# Patient Record
Sex: Female | Born: 1960 | Race: White | Hispanic: No | Marital: Married | Smoking: Current some day smoker
Health system: Southern US, Community
[De-identification: ages and names within clinical notes are randomized; demographics above are authoritative.]

## PROBLEM LIST (undated history)

## (undated) DIAGNOSIS — C4490 Unspecified malignant neoplasm of skin, unspecified: Secondary | ICD-10-CM

## (undated) DIAGNOSIS — G8929 Other chronic pain: Secondary | ICD-10-CM

## (undated) DIAGNOSIS — C801 Malignant (primary) neoplasm, unspecified: Secondary | ICD-10-CM

## (undated) DIAGNOSIS — K219 Gastro-esophageal reflux disease without esophagitis: Secondary | ICD-10-CM

## (undated) DIAGNOSIS — G4700 Insomnia, unspecified: Secondary | ICD-10-CM

## (undated) DIAGNOSIS — I10 Essential (primary) hypertension: Secondary | ICD-10-CM

## (undated) DIAGNOSIS — F419 Anxiety disorder, unspecified: Secondary | ICD-10-CM

## (undated) DIAGNOSIS — C7800 Secondary malignant neoplasm of unspecified lung: Secondary | ICD-10-CM

## (undated) DIAGNOSIS — M1990 Unspecified osteoarthritis, unspecified site: Secondary | ICD-10-CM

## (undated) HISTORY — PX: ABDOMINAL HYSTERECTOMY: SHX81

## (undated) HISTORY — DX: Unspecified osteoarthritis, unspecified site: M19.90

## (undated) HISTORY — DX: Malignant (primary) neoplasm, unspecified: C80.1

## (undated) HISTORY — DX: Secondary malignant neoplasm of unspecified lung: C78.00

## (undated) HISTORY — DX: Essential (primary) hypertension: I10

## (undated) HISTORY — DX: Unspecified malignant neoplasm of skin, unspecified: C44.90

## (undated) HISTORY — PX: OTHER SURGICAL HISTORY: SHX169

## (undated) HISTORY — DX: Insomnia, unspecified: G47.00

## (undated) HISTORY — DX: Anxiety disorder, unspecified: F41.9

---

## 2013-07-13 ENCOUNTER — Emergency Department: Payer: Self-pay | Admitting: Internal Medicine

## 2013-07-13 LAB — CBC
HCT: 41.5 % (ref 35.0–47.0)
HGB: 13.9 g/dL (ref 12.0–16.0)
MCH: 31.1 pg (ref 26.0–34.0)
MCHC: 33.6 g/dL (ref 32.0–36.0)
MCV: 93 fL (ref 80–100)
Platelet: 269 10*3/uL (ref 150–440)
RBC: 4.49 10*6/uL (ref 3.80–5.20)
RDW: 12.6 % (ref 11.5–14.5)
WBC: 12.1 10*3/uL — AB (ref 3.6–11.0)

## 2013-07-13 LAB — BASIC METABOLIC PANEL
Anion Gap: 4 — ABNORMAL LOW (ref 7–16)
BUN: 13 mg/dL (ref 7–18)
CREATININE: 0.64 mg/dL (ref 0.60–1.30)
Calcium, Total: 8.7 mg/dL (ref 8.5–10.1)
Chloride: 107 mmol/L (ref 98–107)
Co2: 27 mmol/L (ref 21–32)
EGFR (African American): >60
EGFR (Non-African Amer.): >60
GLUCOSE: 123 mg/dL — AB (ref 65–99)
OSMOLALITY: 277 (ref 275–301)
Potassium: 4.2 mmol/L (ref 3.5–5.1)
Sodium: 138 mmol/L (ref 136–145)

## 2013-07-13 LAB — TROPONIN I: Troponin-I: <0.02 ng/mL

## 2013-07-15 ENCOUNTER — Ambulatory Visit: Payer: Self-pay | Admitting: Oncology

## 2013-07-17 ENCOUNTER — Ambulatory Visit: Payer: Self-pay | Admitting: Oncology

## 2013-07-19 LAB — COMPREHENSIVE METABOLIC PANEL
ALBUMIN: 3.5 g/dL (ref 3.4–5.0)
Alkaline Phosphatase: 72 U/L
Anion Gap: 7 (ref 7–16)
BUN: 11 mg/dL (ref 7–18)
Bilirubin,Total: 0.2 mg/dL (ref 0.2–1.0)
CHLORIDE: 104 mmol/L (ref 98–107)
CO2: 30 mmol/L (ref 21–32)
Calcium, Total: 8.4 mg/dL — ABNORMAL LOW (ref 8.5–10.1)
Creatinine: 0.64 mg/dL (ref 0.60–1.30)
EGFR (African American): >60
EGFR (Non-African Amer.): >60
GLUCOSE: 129 mg/dL — AB (ref 65–99)
Osmolality: 282 (ref 275–301)
POTASSIUM: 4.2 mmol/L (ref 3.5–5.1)
SGOT(AST): 18 U/L (ref 15–37)
SGPT (ALT): 35 U/L (ref 12–78)
SODIUM: 141 mmol/L (ref 136–145)
Total Protein: 7 g/dL (ref 6.4–8.2)

## 2013-07-19 LAB — CBC CANCER CENTER
Basophil #: 0.1 x10 3/mm (ref 0.0–0.1)
Basophil %: 1.1 %
EOS ABS: 0.2 x10 3/mm (ref 0.0–0.7)
EOS PCT: 2.1 %
HCT: 38.4 % (ref 35.0–47.0)
HGB: 12.7 g/dL (ref 12.0–16.0)
Lymphocyte #: 3.7 x10 3/mm — ABNORMAL HIGH (ref 1.0–3.6)
Lymphocyte %: 44.1 %
MCH: 30.8 pg (ref 26.0–34.0)
MCHC: 33.2 g/dL (ref 32.0–36.0)
MCV: 93 fL (ref 80–100)
MONOS PCT: 8.1 %
Monocyte #: 0.7 x10 3/mm (ref 0.2–0.9)
NEUTROS ABS: 3.7 x10 3/mm (ref 1.4–6.5)
Neutrophil %: 44.6 %
Platelet: 245 x10 3/mm (ref 150–440)
RBC: 4.14 10*6/uL (ref 3.80–5.20)
RDW: 12.7 % (ref 11.5–14.5)
WBC: 8.3 x10 3/mm (ref 3.6–11.0)

## 2013-07-21 ENCOUNTER — Ambulatory Visit: Payer: Self-pay | Admitting: Oncology

## 2013-08-21 ENCOUNTER — Ambulatory Visit: Payer: Self-pay | Admitting: Oncology

## 2013-08-22 ENCOUNTER — Ambulatory Visit: Payer: Self-pay

## 2013-08-22 LAB — APTT: Activated PTT: 50.4 secs — ABNORMAL HIGH (ref 23.6–35.9)

## 2013-08-22 LAB — PROTIME-INR
INR: 1
Prothrombin Time: 13.1 secs (ref 11.5–14.7)

## 2013-08-29 LAB — COMPREHENSIVE METABOLIC PANEL
ALBUMIN: 3.7 g/dL (ref 3.4–5.0)
ALK PHOS: 53 U/L
Anion Gap: 9 (ref 7–16)
BUN: 7 mg/dL (ref 7–18)
Bilirubin,Total: 0.5 mg/dL (ref 0.2–1.0)
CO2: 30 mmol/L (ref 21–32)
Calcium, Total: 8.7 mg/dL (ref 8.5–10.1)
Chloride: 105 mmol/L (ref 98–107)
Creatinine: 0.72 mg/dL (ref 0.60–1.30)
EGFR (Non-African Amer.): >60
Glucose: 95 mg/dL (ref 65–99)
OSMOLALITY: 285 (ref 275–301)
POTASSIUM: 3.8 mmol/L (ref 3.5–5.1)
SGOT(AST): 13 U/L — ABNORMAL LOW (ref 15–37)
SGPT (ALT): 18 U/L (ref 12–78)
SODIUM: 144 mmol/L (ref 136–145)
Total Protein: 7.2 g/dL (ref 6.4–8.2)

## 2013-08-29 LAB — PROTIME-INR
INR: 1
Prothrombin Time: 13.3 secs (ref 11.5–14.7)

## 2013-08-29 LAB — CBC CANCER CENTER
BASOS PCT: 0.8 %
Basophil #: 0.1 x10 3/mm (ref 0.0–0.1)
EOS ABS: 0.1 x10 3/mm (ref 0.0–0.7)
Eosinophil %: 1.6 %
HCT: 38.5 % (ref 35.0–47.0)
HGB: 12.6 g/dL (ref 12.0–16.0)
Lymphocyte #: 2.6 x10 3/mm (ref 1.0–3.6)
Lymphocyte %: 31.2 %
MCH: 30 pg (ref 26.0–34.0)
MCHC: 32.8 g/dL (ref 32.0–36.0)
MCV: 91 fL (ref 80–100)
Monocyte #: 0.6 x10 3/mm (ref 0.2–0.9)
Monocyte %: 6.9 %
Neutrophil #: 4.9 x10 3/mm (ref 1.4–6.5)
Neutrophil %: 59.5 %
Platelet: 246 x10 3/mm (ref 150–440)
RBC: 4.21 10*6/uL (ref 3.80–5.20)
RDW: 12.2 % (ref 11.5–14.5)
WBC: 8.3 x10 3/mm (ref 3.6–11.0)

## 2013-08-29 LAB — APTT: ACTIVATED PTT: 33.5 s (ref 23.6–35.9)

## 2013-08-30 ENCOUNTER — Inpatient Hospital Stay: Payer: Self-pay

## 2013-08-30 DIAGNOSIS — C801 Malignant (primary) neoplasm, unspecified: Secondary | ICD-10-CM

## 2013-08-30 HISTORY — DX: Malignant (primary) neoplasm, unspecified: C80.1

## 2013-08-31 LAB — CBC WITH DIFFERENTIAL/PLATELET
BASOS ABS: 0 10*3/uL (ref 0.0–0.1)
BASOS PCT: 0.3 %
EOS PCT: 0.1 %
Eosinophil #: 0 10*3/uL (ref 0.0–0.7)
HCT: 36.4 % (ref 35.0–47.0)
HGB: 12.1 g/dL (ref 12.0–16.0)
Lymphocyte #: 2.3 10*3/uL (ref 1.0–3.6)
Lymphocyte %: 14.8 %
MCH: 30.3 pg (ref 26.0–34.0)
MCHC: 33.3 g/dL (ref 32.0–36.0)
MCV: 91 fL (ref 80–100)
Monocyte #: 1 x10 3/mm — ABNORMAL HIGH (ref 0.2–0.9)
Monocyte %: 6.4 %
Neutrophil #: 11.9 10*3/uL — ABNORMAL HIGH (ref 1.4–6.5)
Neutrophil %: 78.4 %
PLATELETS: 238 10*3/uL (ref 150–440)
RBC: 4.01 10*6/uL (ref 3.80–5.20)
RDW: 12.5 % (ref 11.5–14.5)
WBC: 15.2 10*3/uL — ABNORMAL HIGH (ref 3.6–11.0)

## 2013-08-31 LAB — BASIC METABOLIC PANEL
Anion Gap: 2 — ABNORMAL LOW (ref 7–16)
BUN: 7 mg/dL (ref 7–18)
CALCIUM: 8 mg/dL — AB (ref 8.5–10.1)
CHLORIDE: 106 mmol/L (ref 98–107)
CO2: 28 mmol/L (ref 21–32)
CREATININE: 0.64 mg/dL (ref 0.60–1.30)
EGFR (African American): >60
EGFR (Non-African Amer.): >60
Glucose: 114 mg/dL — ABNORMAL HIGH (ref 65–99)
Osmolality: 271 (ref 275–301)
Potassium: 4.4 mmol/L (ref 3.5–5.1)
Sodium: 136 mmol/L (ref 136–145)

## 2013-09-01 LAB — CBC WITH DIFFERENTIAL/PLATELET
BASOS ABS: 0.1 10*3/uL (ref 0.0–0.1)
BASOS PCT: 0.4 %
Eosinophil #: 0.1 10*3/uL (ref 0.0–0.7)
Eosinophil %: 0.9 %
HCT: 35.4 % (ref 35.0–47.0)
HGB: 11.7 g/dL — AB (ref 12.0–16.0)
LYMPHS ABS: 2.7 10*3/uL (ref 1.0–3.6)
Lymphocyte %: 20.9 %
MCH: 30.2 pg (ref 26.0–34.0)
MCHC: 33 g/dL (ref 32.0–36.0)
MCV: 92 fL (ref 80–100)
MONO ABS: 1 x10 3/mm — AB (ref 0.2–0.9)
MONOS PCT: 8.2 %
NEUTROS PCT: 69.6 %
Neutrophil #: 8.9 10*3/uL — ABNORMAL HIGH (ref 1.4–6.5)
PLATELETS: 200 10*3/uL (ref 150–440)
RBC: 3.86 10*6/uL (ref 3.80–5.20)
RDW: 12.2 % (ref 11.5–14.5)
WBC: 12.7 10*3/uL — ABNORMAL HIGH (ref 3.6–11.0)

## 2013-09-02 LAB — CBC WITH DIFFERENTIAL/PLATELET
Basophil #: 0.1 10*3/uL (ref 0.0–0.1)
Basophil %: 0.7 %
Eosinophil #: 0.3 10*3/uL (ref 0.0–0.7)
Eosinophil %: 3 %
HCT: 33.4 % — AB (ref 35.0–47.0)
HGB: 11.2 g/dL — AB (ref 12.0–16.0)
Lymphocyte #: 2.5 10*3/uL (ref 1.0–3.6)
Lymphocyte %: 27.1 %
MCH: 30.6 pg (ref 26.0–34.0)
MCHC: 33.5 g/dL (ref 32.0–36.0)
MCV: 92 fL (ref 80–100)
Monocyte #: 0.9 x10 3/mm (ref 0.2–0.9)
Monocyte %: 10.1 %
NEUTROS PCT: 59.1 %
Neutrophil #: 5.4 10*3/uL (ref 1.4–6.5)
PLATELETS: 211 10*3/uL (ref 150–440)
RBC: 3.66 10*6/uL — ABNORMAL LOW (ref 3.80–5.20)
RDW: 12.5 % (ref 11.5–14.5)
WBC: 9.1 10*3/uL (ref 3.6–11.0)

## 2013-09-02 LAB — BASIC METABOLIC PANEL
Anion Gap: 5 — ABNORMAL LOW (ref 7–16)
BUN: 4 mg/dL — AB (ref 7–18)
CHLORIDE: 106 mmol/L (ref 98–107)
CREATININE: 0.46 mg/dL — AB (ref 0.60–1.30)
Calcium, Total: 7.9 mg/dL — ABNORMAL LOW (ref 8.5–10.1)
Co2: 28 mmol/L (ref 21–32)
EGFR (African American): >60
EGFR (Non-African Amer.): >60
Glucose: 93 mg/dL (ref 65–99)
Osmolality: 274 (ref 275–301)
POTASSIUM: 3.8 mmol/L (ref 3.5–5.1)
Sodium: 139 mmol/L (ref 136–145)

## 2013-09-04 LAB — PATHOLOGY REPORT

## 2013-09-12 LAB — CBC CANCER CENTER
BASOS ABS: 0.1 x10 3/mm (ref 0.0–0.1)
BASOS PCT: 1.8 %
EOS ABS: 0.5 x10 3/mm (ref 0.0–0.7)
Eosinophil %: 6.8 %
HCT: 37.8 % (ref 35.0–47.0)
HGB: 12.3 g/dL (ref 12.0–16.0)
LYMPHS PCT: 40.4 %
Lymphocyte #: 3.1 x10 3/mm (ref 1.0–3.6)
MCH: 30.1 pg (ref 26.0–34.0)
MCHC: 32.5 g/dL (ref 32.0–36.0)
MCV: 93 fL (ref 80–100)
MONOS PCT: 6.6 %
Monocyte #: 0.5 x10 3/mm (ref 0.2–0.9)
Neutrophil #: 3.4 x10 3/mm (ref 1.4–6.5)
Neutrophil %: 44.4 %
PLATELETS: 334 x10 3/mm (ref 150–440)
RBC: 4.09 10*6/uL (ref 3.80–5.20)
RDW: 12.2 % (ref 11.5–14.5)
WBC: 7.7 x10 3/mm (ref 3.6–11.0)

## 2013-09-12 LAB — COMPREHENSIVE METABOLIC PANEL
ALBUMIN: 3.6 g/dL (ref 3.4–5.0)
Alkaline Phosphatase: 62 U/L
Anion Gap: 8 (ref 7–16)
BUN: 14 mg/dL (ref 7–18)
Bilirubin,Total: 0.1 mg/dL — ABNORMAL LOW (ref 0.2–1.0)
CO2: 31 mmol/L (ref 21–32)
CREATININE: 0.73 mg/dL (ref 0.60–1.30)
Calcium, Total: 9.2 mg/dL (ref 8.5–10.1)
Chloride: 104 mmol/L (ref 98–107)
EGFR (African American): >60
EGFR (Non-African Amer.): >60
Glucose: 107 mg/dL — ABNORMAL HIGH (ref 65–99)
Osmolality: 286 (ref 275–301)
Potassium: 4.5 mmol/L (ref 3.5–5.1)
SGOT(AST): 12 U/L — ABNORMAL LOW (ref 15–37)
SGPT (ALT): 25 U/L (ref 12–78)
Sodium: 143 mmol/L (ref 136–145)
Total Protein: 7.2 g/dL (ref 6.4–8.2)

## 2013-09-20 ENCOUNTER — Ambulatory Visit: Payer: Self-pay | Admitting: Oncology

## 2013-09-30 LAB — COMPREHENSIVE METABOLIC PANEL
ALBUMIN: 3.3 g/dL — AB (ref 3.4–5.0)
ALK PHOS: 69 U/L
ALT: 26 U/L (ref 12–78)
ANION GAP: 6 — AB (ref 7–16)
AST: 20 U/L (ref 15–37)
BUN: 10 mg/dL (ref 7–18)
Bilirubin,Total: 0.3 mg/dL (ref 0.2–1.0)
CHLORIDE: 101 mmol/L (ref 98–107)
Calcium, Total: 8.5 mg/dL (ref 8.5–10.1)
Co2: 30 mmol/L (ref 21–32)
Creatinine: 0.74 mg/dL (ref 0.60–1.30)
EGFR (African American): >60
Glucose: 117 mg/dL — ABNORMAL HIGH (ref 65–99)
Osmolality: 274 (ref 275–301)
Potassium: 4.1 mmol/L (ref 3.5–5.1)
Sodium: 137 mmol/L (ref 136–145)
Total Protein: 6.9 g/dL (ref 6.4–8.2)

## 2013-09-30 LAB — URINALYSIS, COMPLETE
Bilirubin,UR: NEGATIVE
GLUCOSE, UR: NEGATIVE mg/dL (ref 0–75)
Ketone: NEGATIVE
Nitrite: NEGATIVE
PH: 7 (ref 4.5–8.0)
Protein: NEGATIVE
RBC,UR: 2 /HPF (ref 0–5)
Specific Gravity: 1.006 (ref 1.003–1.030)
Squamous Epithelial: 1

## 2013-09-30 LAB — CBC CANCER CENTER
Basophil #: 0.1 x10 3/mm (ref 0.0–0.1)
Basophil %: 0.9 %
EOS ABS: 0.4 x10 3/mm (ref 0.0–0.7)
Eosinophil %: 6.6 %
HCT: 34 % — ABNORMAL LOW (ref 35.0–47.0)
HGB: 11.6 g/dL — ABNORMAL LOW (ref 12.0–16.0)
Lymphocyte #: 2 x10 3/mm (ref 1.0–3.6)
Lymphocyte %: 36.6 %
MCH: 30.6 pg (ref 26.0–34.0)
MCHC: 34.1 g/dL (ref 32.0–36.0)
MCV: 90 fL (ref 80–100)
MONOS PCT: 11.4 %
Monocyte #: 0.6 x10 3/mm (ref 0.2–0.9)
Neutrophil #: 2.5 x10 3/mm (ref 1.4–6.5)
Neutrophil %: 44.5 %
PLATELETS: 255 x10 3/mm (ref 150–440)
RBC: 3.78 10*6/uL — AB (ref 3.80–5.20)
RDW: 12.1 % (ref 11.5–14.5)
WBC: 5.6 x10 3/mm (ref 3.6–11.0)

## 2013-10-01 LAB — URINE CULTURE

## 2013-10-07 LAB — CBC CANCER CENTER
BASOS ABS: 0 x10 3/mm (ref 0.0–0.1)
Basophil %: 0.5 %
Eosinophil #: 0.3 x10 3/mm (ref 0.0–0.7)
Eosinophil %: 3.9 %
HCT: 36.5 % (ref 35.0–47.0)
HGB: 12.7 g/dL (ref 12.0–16.0)
Lymphocyte #: 2.5 x10 3/mm (ref 1.0–3.6)
Lymphocyte %: 30.4 %
MCH: 30.6 pg (ref 26.0–34.0)
MCHC: 34.6 g/dL (ref 32.0–36.0)
MCV: 88 fL (ref 80–100)
MONOS PCT: 1 %
Monocyte #: 0.1 x10 3/mm — ABNORMAL LOW (ref 0.2–0.9)
NEUTROS ABS: 5.3 x10 3/mm (ref 1.4–6.5)
Neutrophil %: 64.2 %
PLATELETS: 270 x10 3/mm (ref 150–440)
RBC: 4.14 10*6/uL (ref 3.80–5.20)
RDW: 12.1 % (ref 11.5–14.5)
WBC: 8.3 x10 3/mm (ref 3.6–11.0)

## 2013-10-15 LAB — CBC CANCER CENTER
Basophil #: 0 x10 3/mm (ref 0.0–0.1)
Basophil %: 0.4 %
EOS ABS: 0.1 x10 3/mm (ref 0.0–0.7)
Eosinophil %: 1.1 %
HCT: 39.3 % (ref 35.0–47.0)
HGB: 13.2 g/dL (ref 12.0–16.0)
Lymphocyte #: 2 x10 3/mm (ref 1.0–3.6)
Lymphocyte %: 26.6 %
MCH: 30.2 pg (ref 26.0–34.0)
MCHC: 33.6 g/dL (ref 32.0–36.0)
MCV: 90 fL (ref 80–100)
Monocyte #: 0.4 x10 3/mm (ref 0.2–0.9)
Monocyte %: 5 %
NEUTROS PCT: 66.9 %
Neutrophil #: 4.9 x10 3/mm (ref 1.4–6.5)
PLATELETS: 218 x10 3/mm (ref 150–440)
RBC: 4.38 10*6/uL (ref 3.80–5.20)
RDW: 12 % (ref 11.5–14.5)
WBC: 7.4 x10 3/mm (ref 3.6–11.0)

## 2013-10-21 ENCOUNTER — Ambulatory Visit: Payer: Self-pay | Admitting: Oncology

## 2013-10-21 LAB — CBC CANCER CENTER
BASOS ABS: 0 x10 3/mm (ref 0.0–0.1)
Basophil %: 0.7 %
EOS PCT: 6.5 %
Eosinophil #: 0.4 x10 3/mm (ref 0.0–0.7)
HCT: 36.8 % (ref 35.0–47.0)
HGB: 12.4 g/dL (ref 12.0–16.0)
Lymphocyte #: 3.4 x10 3/mm (ref 1.0–3.6)
Lymphocyte %: 59.1 %
MCH: 30.6 pg (ref 26.0–34.0)
MCHC: 33.7 g/dL (ref 32.0–36.0)
MCV: 91 fL (ref 80–100)
MONOS PCT: 9.1 %
Monocyte #: 0.5 x10 3/mm (ref 0.2–0.9)
NEUTROS ABS: 1.4 x10 3/mm (ref 1.4–6.5)
NEUTROS PCT: 24.6 %
PLATELETS: 314 x10 3/mm (ref 150–440)
RBC: 4.04 10*6/uL (ref 3.80–5.20)
RDW: 12.9 % (ref 11.5–14.5)
WBC: 5.8 x10 3/mm (ref 3.6–11.0)

## 2013-10-29 LAB — COMPREHENSIVE METABOLIC PANEL
ANION GAP: 7 (ref 7–16)
Albumin: 3.7 g/dL (ref 3.4–5.0)
Alkaline Phosphatase: 55 U/L
BUN: 8 mg/dL (ref 7–18)
Bilirubin,Total: 0.3 mg/dL (ref 0.2–1.0)
Calcium, Total: 8.8 mg/dL (ref 8.5–10.1)
Chloride: 106 mmol/L (ref 98–107)
Co2: 31 mmol/L (ref 21–32)
Creatinine: 0.66 mg/dL (ref 0.60–1.30)
EGFR (African American): >60
Glucose: 107 mg/dL — ABNORMAL HIGH (ref 65–99)
Osmolality: 286 (ref 275–301)
POTASSIUM: 4.1 mmol/L (ref 3.5–5.1)
SGOT(AST): 28 U/L (ref 15–37)
SGPT (ALT): 51 U/L (ref 12–78)
Sodium: 144 mmol/L (ref 136–145)
Total Protein: 7.2 g/dL (ref 6.4–8.2)

## 2013-10-29 LAB — CBC CANCER CENTER
BASOS PCT: 0.8 %
Basophil #: 0.1 x10 3/mm (ref 0.0–0.1)
EOS ABS: 0.1 x10 3/mm (ref 0.0–0.7)
Eosinophil %: 2.2 %
HCT: 38 % (ref 35.0–47.0)
HGB: 12.8 g/dL (ref 12.0–16.0)
LYMPHS ABS: 2.1 x10 3/mm (ref 1.0–3.6)
LYMPHS PCT: 34.8 %
MCH: 30.5 pg (ref 26.0–34.0)
MCHC: 33.7 g/dL (ref 32.0–36.0)
MCV: 91 fL (ref 80–100)
Monocyte #: 0.4 x10 3/mm (ref 0.2–0.9)
Monocyte %: 6.9 %
NEUTROS ABS: 3.4 x10 3/mm (ref 1.4–6.5)
NEUTROS PCT: 55.3 %
Platelet: 261 x10 3/mm (ref 150–440)
RBC: 4.2 10*6/uL (ref 3.80–5.20)
RDW: 13.7 % (ref 11.5–14.5)
WBC: 6.2 x10 3/mm (ref 3.6–11.0)

## 2013-11-04 LAB — CBC CANCER CENTER
BASOS PCT: 0.8 %
Basophil #: 0.1 x10 3/mm (ref 0.0–0.1)
EOS PCT: 0.6 %
Eosinophil #: 0 x10 3/mm (ref 0.0–0.7)
HCT: 39.4 % (ref 35.0–47.0)
HGB: 13.2 g/dL (ref 12.0–16.0)
Lymphocyte #: 2.4 x10 3/mm (ref 1.0–3.6)
Lymphocyte %: 33.5 %
MCH: 30.5 pg (ref 26.0–34.0)
MCHC: 33.4 g/dL (ref 32.0–36.0)
MCV: 91 fL (ref 80–100)
MONO ABS: 0.3 x10 3/mm (ref 0.2–0.9)
MONOS PCT: 3.7 %
Neutrophil #: 4.4 x10 3/mm (ref 1.4–6.5)
Neutrophil %: 61.4 %
PLATELETS: 210 x10 3/mm (ref 150–440)
RBC: 4.32 10*6/uL (ref 3.80–5.20)
RDW: 13.5 % (ref 11.5–14.5)
WBC: 7.2 x10 3/mm (ref 3.6–11.0)

## 2013-11-11 LAB — CBC CANCER CENTER
Basophil #: 0 x10 3/mm (ref 0.0–0.1)
Basophil %: 0.6 %
EOS PCT: 0.8 %
Eosinophil #: 0 x10 3/mm (ref 0.0–0.7)
HCT: 37.9 % (ref 35.0–47.0)
HGB: 12.6 g/dL (ref 12.0–16.0)
Lymphocyte #: 2.6 x10 3/mm (ref 1.0–3.6)
Lymphocyte %: 41.3 %
MCH: 30.6 pg (ref 26.0–34.0)
MCHC: 33.3 g/dL (ref 32.0–36.0)
MCV: 92 fL (ref 80–100)
MONO ABS: 0.5 x10 3/mm (ref 0.2–0.9)
Monocyte %: 8.1 %
NEUTROS ABS: 3 x10 3/mm (ref 1.4–6.5)
Neutrophil %: 49.2 %
Platelet: 196 x10 3/mm (ref 150–440)
RBC: 4.12 10*6/uL (ref 3.80–5.20)
RDW: 13.9 % (ref 11.5–14.5)
WBC: 6.2 x10 3/mm (ref 3.6–11.0)

## 2013-11-11 LAB — COMPREHENSIVE METABOLIC PANEL
ANION GAP: 8 (ref 7–16)
Albumin: 3.7 g/dL (ref 3.4–5.0)
Alkaline Phosphatase: 64 U/L
BUN: 6 mg/dL — AB (ref 7–18)
Bilirubin,Total: 0.3 mg/dL (ref 0.2–1.0)
CALCIUM: 8.8 mg/dL (ref 8.5–10.1)
CHLORIDE: 102 mmol/L (ref 98–107)
Co2: 30 mmol/L (ref 21–32)
Creatinine: 0.65 mg/dL (ref 0.60–1.30)
EGFR (African American): >60
EGFR (Non-African Amer.): >60
GLUCOSE: 108 mg/dL — AB (ref 65–99)
Osmolality: 278 (ref 275–301)
POTASSIUM: 4 mmol/L (ref 3.5–5.1)
SGOT(AST): 21 U/L (ref 15–37)
SGPT (ALT): 34 U/L (ref 12–78)
Sodium: 140 mmol/L (ref 136–145)
Total Protein: 7.3 g/dL (ref 6.4–8.2)

## 2013-11-18 LAB — COMPREHENSIVE METABOLIC PANEL
ALBUMIN: 3.4 g/dL (ref 3.4–5.0)
ALK PHOS: 65 U/L
AST: 29 U/L (ref 15–37)
Anion Gap: 4 — ABNORMAL LOW (ref 7–16)
BILIRUBIN TOTAL: 0.3 mg/dL (ref 0.2–1.0)
BUN: 7 mg/dL (ref 7–18)
CHLORIDE: 105 mmol/L (ref 98–107)
CREATININE: 0.82 mg/dL (ref 0.60–1.30)
Calcium, Total: 8.1 mg/dL — ABNORMAL LOW (ref 8.5–10.1)
Co2: 29 mmol/L (ref 21–32)
EGFR (African American): >60
EGFR (Non-African Amer.): >60
Glucose: 101 mg/dL — ABNORMAL HIGH (ref 65–99)
OSMOLALITY: 274 (ref 275–301)
Potassium: 3.9 mmol/L (ref 3.5–5.1)
SGPT (ALT): 33 U/L (ref 12–78)
SODIUM: 138 mmol/L (ref 136–145)
Total Protein: 6.9 g/dL (ref 6.4–8.2)

## 2013-11-18 LAB — CBC CANCER CENTER
BASOS PCT: 0.7 %
Basophil #: 0 x10 3/mm (ref 0.0–0.1)
EOS PCT: 2.2 %
Eosinophil #: 0.1 x10 3/mm (ref 0.0–0.7)
HCT: 37.5 % (ref 35.0–47.0)
HGB: 12.5 g/dL (ref 12.0–16.0)
LYMPHS ABS: 2 x10 3/mm (ref 1.0–3.6)
Lymphocyte %: 40.7 %
MCH: 30.8 pg (ref 26.0–34.0)
MCHC: 33.4 g/dL (ref 32.0–36.0)
MCV: 92 fL (ref 80–100)
Monocyte #: 0.4 x10 3/mm (ref 0.2–0.9)
Monocyte %: 9 %
Neutrophil #: 2.3 x10 3/mm (ref 1.4–6.5)
Neutrophil %: 47.4 %
PLATELETS: 311 x10 3/mm (ref 150–440)
RBC: 4.06 10*6/uL (ref 3.80–5.20)
RDW: 14.7 % — ABNORMAL HIGH (ref 11.5–14.5)
WBC: 5 x10 3/mm (ref 3.6–11.0)

## 2013-11-20 ENCOUNTER — Ambulatory Visit: Payer: Self-pay | Admitting: Oncology

## 2013-11-25 LAB — CBC CANCER CENTER
BASOS ABS: 0.1 x10 3/mm (ref 0.0–0.1)
BASOS PCT: 0.9 %
Eosinophil #: 0.1 x10 3/mm (ref 0.0–0.7)
Eosinophil %: 1 %
HCT: 35.9 % (ref 35.0–47.0)
HGB: 12.1 g/dL (ref 12.0–16.0)
LYMPHS ABS: 3.1 x10 3/mm (ref 1.0–3.6)
LYMPHS PCT: 51.4 %
MCH: 31.2 pg (ref 26.0–34.0)
MCHC: 33.8 g/dL (ref 32.0–36.0)
MCV: 92 fL (ref 80–100)
MONO ABS: 0.4 x10 3/mm (ref 0.2–0.9)
MONOS PCT: 6.5 %
NEUTROS ABS: 2.4 x10 3/mm (ref 1.4–6.5)
Neutrophil %: 40.2 %
Platelet: 209 x10 3/mm (ref 150–440)
RBC: 3.9 10*6/uL (ref 3.80–5.20)
RDW: 14.6 % — AB (ref 11.5–14.5)
WBC: 5.9 x10 3/mm (ref 3.6–11.0)

## 2013-12-02 LAB — CBC CANCER CENTER
BASOS PCT: 0.7 %
Basophil #: 0.1 x10 3/mm (ref 0.0–0.1)
EOS PCT: 0.7 %
Eosinophil #: 0.1 x10 3/mm (ref 0.0–0.7)
HCT: 36.6 % (ref 35.0–47.0)
HGB: 12 g/dL (ref 12.0–16.0)
LYMPHS ABS: 3.1 x10 3/mm (ref 1.0–3.6)
Lymphocyte %: 41.2 %
MCH: 31.2 pg (ref 26.0–34.0)
MCHC: 32.9 g/dL (ref 32.0–36.0)
MCV: 95 fL (ref 80–100)
MONO ABS: 0.8 x10 3/mm (ref 0.2–0.9)
Monocyte %: 9.9 %
NEUTROS ABS: 3.6 x10 3/mm (ref 1.4–6.5)
Neutrophil %: 47.5 %
PLATELETS: 193 x10 3/mm (ref 150–440)
RBC: 3.86 10*6/uL (ref 3.80–5.20)
RDW: 14.9 % — ABNORMAL HIGH (ref 11.5–14.5)
WBC: 7.6 x10 3/mm (ref 3.6–11.0)

## 2013-12-09 ENCOUNTER — Encounter: Payer: Self-pay | Admitting: General Surgery

## 2013-12-09 LAB — CBC CANCER CENTER
BASOS ABS: 0.1 x10 3/mm (ref 0.0–0.1)
Basophil %: 0.9 %
Eosinophil #: 0.1 x10 3/mm (ref 0.0–0.7)
Eosinophil %: 1.6 %
HCT: 40.8 % (ref 35.0–47.0)
HGB: 13.7 g/dL (ref 12.0–16.0)
LYMPHS PCT: 44.3 %
Lymphocyte #: 2.5 x10 3/mm (ref 1.0–3.6)
MCH: 31.3 pg (ref 26.0–34.0)
MCHC: 33.4 g/dL (ref 32.0–36.0)
MCV: 94 fL (ref 80–100)
MONO ABS: 0.7 x10 3/mm (ref 0.2–0.9)
Monocyte %: 11.8 %
Neutrophil #: 2.4 x10 3/mm (ref 1.4–6.5)
Neutrophil %: 41.4 %
Platelet: 344 x10 3/mm (ref 150–440)
RBC: 4.36 10*6/uL (ref 3.80–5.20)
RDW: 15.5 % — ABNORMAL HIGH (ref 11.5–14.5)
WBC: 5.8 x10 3/mm (ref 3.6–11.0)

## 2013-12-09 LAB — COMPREHENSIVE METABOLIC PANEL
ALBUMIN: 4.2 g/dL (ref 3.4–5.0)
ALK PHOS: 74 U/L
ALT: 55 U/L (ref 12–78)
AST: 52 U/L — AB (ref 15–37)
Anion Gap: 11 (ref 7–16)
BUN: 12 mg/dL (ref 7–18)
Bilirubin,Total: 0.5 mg/dL (ref 0.2–1.0)
CALCIUM: 9 mg/dL (ref 8.5–10.1)
CREATININE: 0.75 mg/dL (ref 0.60–1.30)
Chloride: 103 mmol/L (ref 98–107)
Co2: 25 mmol/L (ref 21–32)
EGFR (African American): >60
EGFR (Non-African Amer.): >60
Glucose: 106 mg/dL — ABNORMAL HIGH (ref 65–99)
Osmolality: 278 (ref 275–301)
Potassium: 3.7 mmol/L (ref 3.5–5.1)
Sodium: 139 mmol/L (ref 136–145)
Total Protein: 8.2 g/dL (ref 6.4–8.2)

## 2013-12-17 ENCOUNTER — Encounter: Payer: Self-pay | Admitting: General Surgery

## 2013-12-17 ENCOUNTER — Ambulatory Visit (INDEPENDENT_AMBULATORY_CARE_PROVIDER_SITE_OTHER): Payer: BC Managed Care – PPO | Admitting: General Surgery

## 2013-12-17 VITALS — BP 132/80 | HR 76 | Resp 16 | Ht 62.0 in | Wt 177.0 lb

## 2013-12-17 DIAGNOSIS — K8020 Calculus of gallbladder without cholecystitis without obstruction: Secondary | ICD-10-CM

## 2013-12-17 NOTE — Patient Instructions (Addendum)
Cholecystostomy  The gallbladder is a pear-shaped organ that lies beneath the liver on the right side of the body. The gallbladder stores bile, a fluid that helps the body digest fats. However, sometimes bile and other fluids build up in the gallbladder because of an obstruction (for example, a gallstones). This can cause fever, pain, swelling, nausea and other serious symptoms. The procedure used to drain these fluids is called a cholecystostomy. A tube is inserted into the gallbladder. Fluid drains through the tube into a plastic bag outside the body. This procedure is usually done on people who are admitted to the hospital. The procedure is often recommended for people who cannot have gallbladder surgery right away, usually because they are too ill to make it through surgery. The cholecystostomy tube is usually temporary, until surgery can be done. RISKS AND COMPLICATIONS Although rare, complications can include:  Clogging of the tube.  Infection in or around the drain site. Antibiotics might be prescribed for the infection. Or, another tube might be inserted to drain the infected fluid.  Internal bleeding from the liver. BEFORE THE PROCEDURE   Try to quit smoking several weeks before the procedure. Smoking can slow healing.  Arrange for someone to drive you home from the hospital.  Right before your procedure, avoid all foods and liquids after midnight. This includes coffee, tea and water.  On the day of the procedure, arrive early to fill out all the paperwork. PROCEDURE You will be given a sedative to make you sleepy and a local anesthetic to numb the skin. Next, a small cut is made in the abdomen. Then a tube is threaded through the cut into the gallbladder. The procedure is usually done with ultrasound to guide the tube into the gallbladder. Once the tube is in place, the drain is secured to the skin with a stitch. The tube is then connected to a drainage bag.  AFTER THE PROCEDURE    People who have a cholecystostomy usually stay in the hospital for several days because they are so ill. You might not be able to eat for the first few days. Instead, you will be connected to an IV for fluids and nutrients.  The procedure does not cure the blockage that caused the fluid to build up in the first place. Because of this, the gallbladder will need to be removed in the future. The drain is removed at that time. HOME CARE INSTRUCTIONS  Be sure to follow your healthcare provider's instructions carefully. You may shower but avoid tub baths and swimming until your caregiver says it is OK. Eat and drink according to the directions you have been given. And be sure to make all follow-up appointments.  Call your healthcare provider if you notice new pain, redness or swelling around the wound. SEEK IMMEDIATE MEDICAL CARE IF:   There is increased abdominal pain.  Nausea or vomiting occurs.  You develop a fever.  The drainage tube comes out of the abdomen. Document Released: 08/05/2008 Document Revised: 08/01/2011 Document Reviewed: 08/05/2008 [facility] Patient Information 2015 [place], [place]. This information is not intended to replace advice given to you by your health care provider. Make sure you discuss any questions you have with your health care provider.  Patient is scheduled for surgery at [facility] on 01/02/14. She will pre admit by phone on 12/24/13. Patient is aware of date and instructions.

## 2013-12-17 NOTE — Progress Notes (Signed)
Patient ID: Natalie Stark, female   DOB: 02/25/1961, 53 y.o.   MRN: [id number]  Chief Complaint  Patient presents with  . Abdominal Pain    choleycystitis    HPI Natalie Stark is a 53 y.o. female  Here for cholecystitis. Has been having sharp pains in her right side since the end of April 2015. Natalie KitchenPatient had a abdominal ultrasound on 12/02/13. She has a history of lung cancer and finished her chemotherapy on 12/09/13.  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 She received 4 cycles of cisplatin and and Alimta. She tolerated her treatments well, but began to have episodes of right upper quadrant pain.Natalie Stark  HPI  Past Medical History  Diagnosis Date  . Cancer 08/30/13    Lung, tx removal of upper left lobe and chemotherapy    Past Surgical History  Procedure Laterality Date  . Upper lobectomy Left   . Cesarean section    . Abdominal hysterectomy      Family History  Problem Relation Age of Onset  . Thyroid cancer Mother   . Thyroid cancer Brother     Social History History  Substance Use Topics  . Smoking status: Former Smoker -- 37 years    Quit date: 05/23/2013  . Smokeless tobacco: Never Used  . Alcohol Use: Yes    No Known Allergies  Current Outpatient Prescriptions  Medication Sig Dispense Refill  . benazepril (LOTENSIN) 40 MG tablet       . clonazePAM (KLONOPIN) 1 MG tablet       . estradiol (ESTRACE) 1 MG tablet       . folic acid (FOLVITE) 1 MG tablet       . oxyCODONE (OXY IR/ROXICODONE) 5 MG immediate release tablet       . promethazine (PHENERGAN) 25 MG tablet       . zolpidem (AMBIEN) 10 MG tablet        No current facility-administered medications for this visit.    Review of Systems Review of Systems  Constitutional: Negative.   Respiratory: Negative.   Cardiovascular: Negative.     Blood pressure 132/80, pulse 76, resp. rate 16, height 5\' 2"   (1.575 m), weight 177 lb (80.287 kg).  Physical Exam Physical Exam  Constitutional: She is oriented to person, place, and time. She appears well-developed and well-nourished.  Eyes: Conjunctivae are normal. No scleral icterus.  Neck: Neck supple.  Cardiovascular: Normal rate, regular rhythm and normal heart sounds.   Pulses:      Dorsalis pedis pulses are 2+ on the right side, and 2+ on the left side.       Posterior tibial pulses are 2+ on the right side, and 2+ on the left side.  Pulmonary/Chest: Effort normal and breath sounds normal.  Well healed left posterior lateral thoracotomy incision..   Abdominal: Soft. Normal appearance and bowel sounds are normal. There is tenderness in the right upper quadrant.  Neurological: She is alert and oriented to person, place, and time.  Skin: Skin is warm and dry.    Data Reviewed Abdominal ultrasound completed October 26, 2013 showed multiple gallstones measuring up to 1.5 cm. Common bile duct 0.5 cm. Diffuse gallbladder wall thickening. No pericholecystic fluid. Laboratory studies dated December 09, 2013 identified a white blood cell count of 5800 with 41% polys, 44% lymphocytes, hemoglobin 13.7 normal SGPT at 55, mildly elevated SGOT at 52. Normal bilirubin  of 0.5, alkaline phosphatase 74. Assessment    Symptomatic cholelithiasis.     Plan    Indication for elective cholecystectomy was reviewed. The risks associated with the procedure including bile duct injury and intestinal injury were discussed.       Patient is scheduled for surgery at [facility] on 01/02/14. She will pre admit by phone on 12/24/13. Patient is aware of date and instructions.   Robert Bellow 12/18/2013, 10:34 PM

## 2013-12-18 ENCOUNTER — Other Ambulatory Visit: Payer: Self-pay | Admitting: General Surgery

## 2013-12-18 DIAGNOSIS — K8020 Calculus of gallbladder without cholecystitis without obstruction: Secondary | ICD-10-CM

## 2013-12-18 DIAGNOSIS — C3490 Malignant neoplasm of unspecified part of unspecified bronchus or lung: Secondary | ICD-10-CM | POA: Insufficient documentation

## 2013-12-21 ENCOUNTER — Ambulatory Visit: Payer: Self-pay | Admitting: Oncology

## 2014-01-02 ENCOUNTER — Encounter: Payer: Self-pay | Admitting: General Surgery

## 2014-01-02 ENCOUNTER — Ambulatory Visit: Payer: Self-pay | Admitting: General Surgery

## 2014-01-02 DIAGNOSIS — K8010 Calculus of gallbladder with chronic cholecystitis without obstruction: Secondary | ICD-10-CM

## 2014-01-02 HISTORY — PX: CHOLECYSTECTOMY: SHX55

## 2014-01-03 LAB — PATHOLOGY REPORT

## 2014-01-06 ENCOUNTER — Encounter: Payer: Self-pay | Admitting: General Surgery

## 2014-01-09 ENCOUNTER — Encounter: Payer: Self-pay | Admitting: General Surgery

## 2014-01-09 ENCOUNTER — Ambulatory Visit (INDEPENDENT_AMBULATORY_CARE_PROVIDER_SITE_OTHER): Payer: Self-pay | Admitting: General Surgery

## 2014-01-09 VITALS — BP 144/86 | HR 88 | Resp 14 | Ht 62.0 in | Wt 177.0 lb

## 2014-01-09 DIAGNOSIS — R1013 Epigastric pain: Secondary | ICD-10-CM

## 2014-01-09 DIAGNOSIS — K3189 Other diseases of stomach and duodenum: Secondary | ICD-10-CM

## 2014-01-09 DIAGNOSIS — K30 Functional dyspepsia: Secondary | ICD-10-CM

## 2014-01-09 DIAGNOSIS — K8020 Calculus of gallbladder without cholecystitis without obstruction: Secondary | ICD-10-CM

## 2014-01-09 MED ORDER — OMEPRAZOLE 40 MG PO CPDR: 40.0000 mg | DELAYED_RELEASE_CAPSULE | Freq: Every day | ORAL | Status: DC

## 2014-01-09 NOTE — Progress Notes (Signed)
Patient ID: Natalie Stark, female   DOB: 06-13-1960, 53 y.o.   MRN: [id number]  Chief Complaint  Patient presents with  . Routine Post Op    Cholecystectomy     HPI Natalie Stark is a 53 y.o. female.  Here today for her postoperative visit, laparoscopic cholecystectomy on 01-02-14. States she is making progress. She does admit to having a bowel movement after she eats. She does notice "gas" and "indigestion" after she eats.  HPI  Past Medical History  Diagnosis Date  . Cancer 08/30/13    Lung, tx removal of upper left lobe and chemotherapy    Past Surgical History  Procedure Laterality Date  . Upper lobectomy Left   . Cesarean section    . Abdominal hysterectomy    . Cholecystectomy  01-02-14    Family History  Problem Relation Age of Onset  . Thyroid cancer Mother   . Thyroid cancer Brother     Social History History  Substance Use Topics  . Smoking status: Former Smoker -- 62 years    Quit date: 05/23/2013  . Smokeless tobacco: Never Used  . Alcohol Use: Yes    No Known Allergies  Current Outpatient Prescriptions  Medication Sig Dispense Refill  . benazepril (LOTENSIN) 40 MG tablet       . clonazePAM (KLONOPIN) 1 MG tablet       . estradiol (ESTRACE) 1 MG tablet       . folic acid (FOLVITE) 1 MG tablet       . oxyCODONE (OXY IR/ROXICODONE) 5 MG immediate release tablet Take 5 mg by mouth every 6 (six) hours as needed.       . promethazine (PHENERGAN) 25 MG tablet       . zolpidem (AMBIEN CR) 12.5 MG CR tablet Take 12.5 mg by mouth at bedtime as needed for sleep.      Marland Kitchen omeprazole (PRILOSEC) 40 MG capsule Take 1 capsule (40 mg total) by mouth daily.  30 capsule  0   No current facility-administered medications for this visit.    Review of Systems Review of Systems  Constitutional: Negative.   Cardiovascular: Negative.   Gastrointestinal: Positive for nausea and diarrhea. Negative for vomiting and constipation.    Blood pressure 144/86, pulse 88,  resp. rate 14, height 5\' 2"  (1.575 m), weight 177 lb (80.287 kg).  Physical Exam Physical Exam  Constitutional: She is oriented to person, place, and time. She appears well-developed and well-nourished.  Neck: Neck supple.  Cardiovascular: Normal rate, regular rhythm and normal heart sounds.   Pulmonary/Chest: Effort normal and breath sounds normal.  Abdominal: Soft. Normal appearance. There is tenderness.  Port sites healing well  Lymphadenopathy:    She has no cervical adenopathy.  Neurological: She is alert and oriented to person, place, and time.  Skin: Skin is warm and dry.    Data Reviewed Pathology showed chronic as well as early acute cholecystitis and cholelithiasis.  Assessment    Doing well status post cholecystectomy. Mild indigestion should respond to transient PPI.     Plan    The patient will be placed on Prilosec 40 mg daily for one month. She'll notify the office if this does not result in relief of her symptoms. Follow up otherwise will be on an as-needed basis.      PCP: Kirkland Hun 01/10/2014, 6:20 PM

## 2014-01-09 NOTE — Patient Instructions (Addendum)
The patient is aware to call back for any questions or concerns.  

## 2014-01-10 DIAGNOSIS — K30 Functional dyspepsia: Secondary | ICD-10-CM | POA: Insufficient documentation

## 2014-01-13 LAB — COMPREHENSIVE METABOLIC PANEL
Albumin: 4 g/dL (ref 3.4–5.0)
Alkaline Phosphatase: 76 U/L
Anion Gap: 14 (ref 7–16)
BUN: 8 mg/dL (ref 7–18)
Bilirubin,Total: 0.3 mg/dL (ref 0.2–1.0)
CALCIUM: 9.4 mg/dL (ref 8.5–10.1)
Chloride: 101 mmol/L (ref 98–107)
Co2: 26 mmol/L (ref 21–32)
Creatinine: 1.08 mg/dL (ref 0.60–1.30)
EGFR (African American): >60
GFR CALC NON AF AMER: 60 — AB
Glucose: 135 mg/dL — ABNORMAL HIGH (ref 65–99)
Osmolality: 282 (ref 275–301)
POTASSIUM: 4.1 mmol/L (ref 3.5–5.1)
SGOT(AST): 34 U/L (ref 15–37)
SGPT (ALT): 51 U/L
Sodium: 141 mmol/L (ref 136–145)
Total Protein: 7.8 g/dL (ref 6.4–8.2)

## 2014-01-13 LAB — CBC CANCER CENTER
Basophil #: 0.1 x10 3/mm (ref 0.0–0.1)
Basophil %: 0.8 %
Eosinophil #: 0.5 x10 3/mm (ref 0.0–0.7)
Eosinophil %: 4.8 %
HCT: 39.9 % (ref 35.0–47.0)
HGB: 13.4 g/dL (ref 12.0–16.0)
LYMPHS ABS: 4.8 x10 3/mm — AB (ref 1.0–3.6)
LYMPHS PCT: 46.2 %
MCH: 32.3 pg (ref 26.0–34.0)
MCHC: 33.5 g/dL (ref 32.0–36.0)
MCV: 96 fL (ref 80–100)
MONO ABS: 0.7 x10 3/mm (ref 0.2–0.9)
MONOS PCT: 6.8 %
Neutrophil #: 4.3 x10 3/mm (ref 1.4–6.5)
Neutrophil %: 41.4 %
Platelet: 258 x10 3/mm (ref 150–440)
RBC: 4.15 10*6/uL (ref 3.80–5.20)
RDW: 15.2 % — ABNORMAL HIGH (ref 11.5–14.5)
WBC: 10.4 x10 3/mm (ref 3.6–11.0)

## 2014-01-21 ENCOUNTER — Ambulatory Visit: Payer: Self-pay | Admitting: Oncology

## 2014-02-05 ENCOUNTER — Ambulatory Visit: Payer: Self-pay | Admitting: Oncology

## 2014-02-06 ENCOUNTER — Telehealth: Payer: Self-pay | Admitting: *Deleted

## 2014-02-06 NOTE — Telephone Encounter (Signed)
I talked with the patient and she said the Prilosec did help. Aware 3 months refill then PCP for future refills, pt agrees.

## 2014-02-06 NOTE — Telephone Encounter (Signed)
Message copied by Carson Myrtle on Thu Feb 06, 2014  8:47 AM ------      Message from: [place], [place]      Created: Thu Feb 06, 2014  8:36 AM       Patient seen last month post chole, placed on PPI for one month. Just received refill request. Check and see how she is doing.  I don't think she will need to be on this all the time. OK to refill if she thinks she needs it for 3 months, further refills by PCP/ cancer center ------

## 2014-02-25 ENCOUNTER — Ambulatory Visit: Payer: Self-pay | Admitting: Oncology

## 2014-02-25 LAB — COMPREHENSIVE METABOLIC PANEL
ALK PHOS: 74 U/L
AST: 27 U/L (ref 15–37)
Albumin: 3.6 g/dL (ref 3.4–5.0)
Anion Gap: 5 — ABNORMAL LOW (ref 7–16)
BUN: 11 mg/dL (ref 7–18)
Bilirubin,Total: 0.5 mg/dL (ref 0.2–1.0)
CO2: 31 mmol/L (ref 21–32)
CREATININE: 0.74 mg/dL (ref 0.60–1.30)
Calcium, Total: 8.7 mg/dL (ref 8.5–10.1)
Chloride: 103 mmol/L (ref 98–107)
EGFR (African American): >60
EGFR (Non-African Amer.): >60
GLUCOSE: 109 mg/dL — AB (ref 65–99)
OSMOLALITY: 278 (ref 275–301)
POTASSIUM: 3.7 mmol/L (ref 3.5–5.1)
SGPT (ALT): 37 U/L
SODIUM: 139 mmol/L (ref 136–145)
Total Protein: 6.9 g/dL (ref 6.4–8.2)

## 2014-02-25 LAB — CBC CANCER CENTER
Basophil #: 0.1 x10 3/mm (ref 0.0–0.1)
Basophil %: 0.8 %
Eosinophil #: 0.1 x10 3/mm (ref 0.0–0.7)
Eosinophil %: 1.3 %
HCT: 36.4 % (ref 35.0–47.0)
HGB: 11.8 g/dL — ABNORMAL LOW (ref 12.0–16.0)
LYMPHS PCT: 32.4 %
Lymphocyte #: 3 x10 3/mm (ref 1.0–3.6)
MCH: 31.3 pg (ref 26.0–34.0)
MCHC: 32.3 g/dL (ref 32.0–36.0)
MCV: 97 fL (ref 80–100)
MONO ABS: 0.6 x10 3/mm (ref 0.2–0.9)
MONOS PCT: 6.9 %
NEUTROS ABS: 5.4 x10 3/mm (ref 1.4–6.5)
NEUTROS PCT: 58.6 %
Platelet: 199 x10 3/mm (ref 150–440)
RBC: 3.76 10*6/uL — ABNORMAL LOW (ref 3.80–5.20)
RDW: 12.6 % (ref 11.5–14.5)
WBC: 9.3 x10 3/mm (ref 3.6–11.0)

## 2014-03-11 ENCOUNTER — Ambulatory Visit (INDEPENDENT_AMBULATORY_CARE_PROVIDER_SITE_OTHER): Payer: BC Managed Care – PPO | Admitting: General Surgery

## 2014-03-11 ENCOUNTER — Encounter: Payer: Self-pay | Admitting: General Surgery

## 2014-03-11 VITALS — BP 122/74 | HR 86 | Resp 14 | Ht 62.0 in | Wt 182.0 lb

## 2014-03-11 DIAGNOSIS — K529 Noninfective gastroenteritis and colitis, unspecified: Secondary | ICD-10-CM | POA: Insufficient documentation

## 2014-03-11 DIAGNOSIS — L729 Follicular cyst of the skin and subcutaneous tissue, unspecified: Secondary | ICD-10-CM | POA: Insufficient documentation

## 2014-03-11 MED ORDER — CHOLESTYRAMINE 4 GM/DOSE PO POWD: 4.0000 g | Freq: Every day | ORAL | Status: DC

## 2014-03-11 NOTE — Progress Notes (Signed)
Patient ID: Natalie Stark, female   DOB: 19-Oct-1960, 53 y.o.   MRN: [id number]  Chief Complaint  Patient presents with  . Cyst    back cyst    HPI Natalie Stark is a 53 y.o. female.  Here today for evaluation of several cyst. She has noticed them for several years and they occasionally drain. The worst is on her back but she has them behind her left ear. She is doing well since her gall bladder surgery, she just has loose/watery BM's daily. She takes imodium (8 tablets/daily).  HPI  Past Medical History  Diagnosis Date  . Cancer 08/30/13    Lung, tx removal of upper left lobe and chemotherapy    Past Surgical History  Procedure Laterality Date  . Upper lobectomy Left   . Cesarean section    . Abdominal hysterectomy    . Cholecystectomy  01-02-14    Family History  Problem Relation Age of Onset  . Thyroid cancer Mother   . Thyroid cancer Brother     Social History History  Substance Use Topics  . Smoking status: Former Smoker -- 35 years    Quit date: 05/23/2013  . Smokeless tobacco: Never Used  . Alcohol Use: Yes    Allergies  Allergen Reactions  . Seroquel [Quetiapine Fumarate] Other (See Comments)    hallunications    Current Outpatient Prescriptions  Medication Sig Dispense Refill  . benazepril (LOTENSIN) 40 MG tablet       . clonazePAM (KLONOPIN) 1 MG tablet       . estradiol (ESTRACE) 1 MG tablet       . loperamide (IMODIUM) 2 MG capsule Take by mouth daily.      . NUCYNTA 50 MG TABS tablet Take 50 mg by mouth every 4 (four) hours as needed.       Marland Kitchen omeprazole (PRILOSEC) 40 MG capsule Take 1 capsule (40 mg total) by mouth daily.  30 capsule  0  . zolpidem (AMBIEN CR) 12.5 MG CR tablet Take 12.5 mg by mouth at bedtime as needed for sleep.      . cholestyramine (QUESTRAN) 4 GM/DOSE powder Take 1 packet (4 g total) by mouth daily.  378 g  12   No current facility-administered medications for this visit.    Review of Systems Review of Systems   Constitutional: Negative.   Respiratory: Negative.   Cardiovascular: Negative.     Blood pressure 122/74, pulse 86, resp. rate 14, height 5\' 2"  (1.575 m), weight 182 lb (82.555 kg).  Physical Exam Physical Exam  Constitutional: She is oriented to person, place, and time. She appears well-developed and well-nourished.  Neurological: She is alert and oriented to person, place, and time.  Skin: Skin is warm and dry.  1.2 cm posterior skin cyst left of midline T5 area. 5 mm cyst n left ear lobe. 3 mm nodule inner aspect right upper arm.  Irregular dermal thickening posterior aspect of the left arm. Difficult to assess    Data Reviewed Gallbladder pathology from December 28, 2013: Chronic as well as early acute cholecystitis and cholelithiasis.  Assessment    Symptomatic skin cyst.  Postprandial diarrhea secondary to cholecystectomy.     Plan    The patient was amenable to cyst excision. This was completed making use of 10 cc of 0.5% Xylocaine 0.25% Marcaine with 1-200,000 units of epinephrine. The lesion was excised through elliptical incision. The scar tissue suggestive of chronicity was evident. Clean edges were obtained. The  deep tissue was approximated with 3-0 Vicryl figure-of-eight suture. The skin was approximated with 4-0 nylon interrupted simple sutures. Telfa and Tegaderm dressing applied.  We'll make use of a trial of Questran daily to see if this improves her diarrhea.     Trial of Questran, she will call back in 2 weeks with status update. Follow up with the nurse in one week for suture removal.   PCP: [facility] Ref Dr Coralee Pesa 03/11/2014, 9:53 PM

## 2014-03-11 NOTE — Patient Instructions (Addendum)
The patient is aware to call back for any questions or concerns. Call back in 12 weeks to let us know if medication is helping BM

## 2014-03-13 LAB — PATHOLOGY

## 2014-03-18 ENCOUNTER — Ambulatory Visit (INDEPENDENT_AMBULATORY_CARE_PROVIDER_SITE_OTHER): Payer: Self-pay | Admitting: *Deleted

## 2014-03-18 DIAGNOSIS — L729 Follicular cyst of the skin and subcutaneous tissue, unspecified: Secondary | ICD-10-CM

## 2014-03-18 NOTE — Progress Notes (Signed)
The sutures were removed and steri strips applied.

## 2014-03-23 ENCOUNTER — Ambulatory Visit: Payer: Self-pay | Admitting: Oncology

## 2014-03-24 ENCOUNTER — Encounter: Payer: Self-pay | Admitting: General Surgery

## 2014-04-07 ENCOUNTER — Telehealth: Payer: Self-pay | Admitting: *Deleted

## 2014-04-07 ENCOUNTER — Telehealth: Payer: Self-pay

## 2014-04-07 NOTE — Telephone Encounter (Signed)
Patient called to let us know that the medication for diarrhea Questran was not working. She reports watery to very loose stools that occur after eating. She also feels like she is going very often.

## 2014-04-09 NOTE — Telephone Encounter (Signed)
Please contact the patient and see if she is ever had a colonoscopy. This will be the next step for persistent diarrhea. If she has had an exam within the last year, she can pick up a prescription for Lomotil at the office as a trial.

## 2014-04-09 NOTE — Telephone Encounter (Signed)
Message left for patient to call back to discuss Dr Dwyane Luo instructions.

## 2014-04-09 NOTE — Telephone Encounter (Signed)
Spoke with patient and she says that the diarrhea has eased up with her eating only rice. She is amendable to having a Colonoscopy done as she has never had one. Patient is scheduled to come in to discuss this on 04/24/14 at 4:00 pm.

## 2014-04-24 ENCOUNTER — Encounter: Payer: Self-pay | Admitting: General Surgery

## 2014-04-24 ENCOUNTER — Ambulatory Visit (INDEPENDENT_AMBULATORY_CARE_PROVIDER_SITE_OTHER): Payer: BC Managed Care – PPO | Admitting: General Surgery

## 2014-04-24 VITALS — BP 112/62 | HR 88 | Resp 16 | Ht 62.0 in | Wt 175.0 lb

## 2014-04-24 DIAGNOSIS — R197 Diarrhea, unspecified: Secondary | ICD-10-CM

## 2014-04-24 DIAGNOSIS — Z1211 Encounter for screening for malignant neoplasm of colon: Secondary | ICD-10-CM

## 2014-04-24 MED ORDER — POLYETHYLENE GLYCOL 3350 17 GM/SCOOP PO POWD: ORAL | Status: DC

## 2014-04-24 NOTE — Patient Instructions (Addendum)
Colonoscopy A colonoscopy is an exam to look at the entire large intestine (colon). This exam can help find problems such as tumors, polyps, inflammation, and areas of bleeding. The exam takes about 1 hour.  LET [facility] CARE PROVIDER KNOW ABOUT:   Any allergies you have.  All medicines you are taking, including vitamins, herbs, eye drops, creams, and over-the-counter medicines.  Previous problems you or members of your family have had with the use of anesthetics.  Any blood disorders you have.  Previous surgeries you have had.  Medical conditions you have. RISKS AND COMPLICATIONS  Generally, this is a safe procedure. However, as with any procedure, complications can occur. Possible complications include:  Bleeding.  Tearing or rupture of the colon wall.  Reaction to medicines given during the exam.  Infection (rare). BEFORE THE PROCEDURE   Ask your health care provider about changing or stopping your regular medicines.  You may be prescribed an oral bowel prep. This involves drinking a large amount of medicated liquid, starting the day before your procedure. The liquid will cause you to have multiple loose stools until your stool is almost clear or light green. This cleans out your colon in preparation for the procedure.  Do not eat or drink anything else once you have started the bowel prep, unless your health care provider tells you it is safe to do so.  Arrange for someone to drive you home after the procedure. PROCEDURE   You will be given medicine to help you relax (sedative).  You will lie on your side with your knees bent.  A long, flexible tube with a light and camera on the end (colonoscope) will be inserted through the rectum and into the colon. The camera sends video back to a computer screen as it moves through the colon. The colonoscope also releases carbon dioxide gas to inflate the colon. This helps your health care provider see the area better.  During  the exam, your health care provider may take a small tissue sample (biopsy) to be examined under a microscope if any abnormalities are found.  The exam is finished when the entire colon has been viewed. AFTER THE PROCEDURE   Do not drive for 24 hours after the exam.  You may have a small amount of blood in your stool.  You may pass moderate amounts of gas and have mild abdominal cramping or bloating. This is caused by the gas used to inflate your colon during the exam.  Ask when your test results will be ready and how you will get your results. Make sure you get your test results. Document Released: 05/06/2000 Document Revised: 02/27/2013 Document Reviewed: 01/14/2013 [facility] Patient Information 2015 [place], [place]. This information is not intended to replace advice given to you by your health care provider. Make sure you discuss any questions you have with your health care provider.  Patient has been scheduled for a colonoscopy on 05-13-14 at [facility].

## 2014-04-24 NOTE — Telephone Encounter (Signed)
error 

## 2014-04-24 NOTE — Progress Notes (Signed)
Patient ID: SOLSTICE LASTINGER, female   DOB: 11/10/1960, 53 y.o.   MRN: [id number]  Chief Complaint  Patient presents with  . Other    evaluation for colonoscopy and diarrhea    HPI CAMDEN MAZZAFERRO is a 53 y.o. female who presents for an evaluation of a colonoscopy. She states she has had diarrhea since August 2015 when she had her gallbladder removed. She states the diarrhea is only in the mornings. The diarrhea is once and is yellowish in color. She denies any abdominal pain. No prior colonoscopies. She states the Lucrezia Starch is not helping.   HPI  Past Medical History  Diagnosis Date  . Cancer 08/30/13    Lung, tx removal of upper left lobe and chemotherapy    Past Surgical History  Procedure Laterality Date  . Upper lobectomy Left   . Cesarean section    . Abdominal hysterectomy    . Cholecystectomy  01-02-14    Family History  Problem Relation Age of Onset  . Thyroid cancer Mother   . Thyroid cancer Brother     Social History History  Substance Use Topics  . Smoking status: Current Every Day Smoker -- 0.25 packs/day for 35 years    Types: Cigarettes    Last Attempt to Quit: 05/23/2013  . Smokeless tobacco: Never Used  . Alcohol Use: 0.0 oz/week    0 Not specified per week    Allergies  Allergen Reactions  . Seroquel [Quetiapine Fumarate] Other (See Comments)    hallunications    Current Outpatient Prescriptions  Medication Sig Dispense Refill  . benazepril (LOTENSIN) 40 MG tablet     . cholestyramine (QUESTRAN) 4 GM/DOSE powder Take 1 packet (4 g total) by mouth daily. 378 g 12  . clonazePAM (KLONOPIN) 1 MG tablet     . estradiol (ESTRACE) 1 MG tablet     . lamoTRIgine (LAMICTAL) 25 MG tablet Take 25 mg by mouth daily.    Marland Kitchen loperamide (IMODIUM) 2 MG capsule Take by mouth daily.    Marland Kitchen lurasidone (LATUDA) 40 MG TABS tablet Take 40 mg by mouth daily with breakfast.    . NUCYNTA 50 MG TABS tablet Take 50 mg by mouth every 4 (four) hours as needed.     Marland Kitchen  omeprazole (PRILOSEC) 40 MG capsule Take 1 capsule (40 mg total) by mouth daily. 30 capsule 0  . zolpidem (AMBIEN CR) 12.5 MG CR tablet Take 12.5 mg by mouth at bedtime as needed for sleep.    . polyethylene glycol powder (GLYCOLAX/MIRALAX) powder 255 grams one bottle for colonoscopy prep 255 g 0   No current facility-administered medications for this visit.    Review of Systems Review of Systems  Constitutional: Negative.   Respiratory: Negative.   Cardiovascular: Negative.   Gastrointestinal: Positive for diarrhea.    Blood pressure 112/62, pulse 88, resp. rate 16, height 5\' 2"  (1.575 m), weight 175 lb (79.379 kg).  Physical Exam Physical Exam  Constitutional: She is oriented to person, place, and time. She appears well-developed and well-nourished.  Neck: Neck supple. No thyromegaly present.  Cardiovascular: Normal rate, regular rhythm and normal heart sounds.   No murmur heard. Pulmonary/Chest: Effort normal and breath sounds normal.  Abdominal: Soft. Normal appearance and bowel sounds are normal. There is no hepatosplenomegaly. There is no tenderness. No hernia.  Lymphadenopathy:    She has no cervical adenopathy.  Neurological: She is alert and oriented to person, place, and time.  Skin: Skin is warm and dry.  Data Reviewed Pathology on the gallbladder showed chronic and acute cholecystitis with cholelithiasis.  Assessment    Diarrhea, possibly related to previous cholecystectomy without improvement on bile salt binding therapy.    Plan    She is a age for a screening colonoscopy and this is been recommended to confirm that no additional pathology is evident. The risks associated with the procedure including bleeding and perforation were discussed.    Patient has been scheduled for a colonoscopy on 05-13-14 at [facility].   PCP:  Fuller Canada 04/25/2014, 2:00 PM

## 2014-04-25 ENCOUNTER — Other Ambulatory Visit: Payer: Self-pay | Admitting: General Surgery

## 2014-04-25 DIAGNOSIS — Z1211 Encounter for screening for malignant neoplasm of colon: Secondary | ICD-10-CM

## 2014-04-25 DIAGNOSIS — R197 Diarrhea, unspecified: Secondary | ICD-10-CM | POA: Insufficient documentation

## 2014-05-13 ENCOUNTER — Ambulatory Visit: Payer: Self-pay | Admitting: General Surgery

## 2014-05-13 DIAGNOSIS — R197 Diarrhea, unspecified: Secondary | ICD-10-CM

## 2014-05-13 DIAGNOSIS — D123 Benign neoplasm of transverse colon: Secondary | ICD-10-CM

## 2014-05-13 HISTORY — PX: COLONOSCOPY: SHX174

## 2014-05-14 ENCOUNTER — Telehealth: Payer: Self-pay | Admitting: *Deleted

## 2014-05-14 NOTE — Telephone Encounter (Signed)
She called to let you know she was still having diarrhea (2 this morning) watery. Is there anything else besides Imodium because Imodium does not work for her? She also wants to know if a stool sample to lab necessary?

## 2014-05-14 NOTE — Telephone Encounter (Signed)
She had watery stools today as she has not had solid food until yesterday.  She can try fibercon tablets: 2 twice a day pending biopsy results to provide some body to the stools.

## 2014-05-17 ENCOUNTER — Telehealth: Payer: Self-pay | Admitting: General Surgery

## 2014-05-17 NOTE — Telephone Encounter (Signed)
Notified pathology showed no evidence of colitis. Polyp at time of surgery in hepatic flexure tissue not retrieved (reported as fat and fecal material).  Multiple loose, pudding like stools with mucous overnight. (Prior to colonoscopy, she was having a loose, pudding like stool once / day. )  Denies exposure to ill family members.  Failed Imodium in the past.  Will call RX for Lomotil, 2.5 mg tablets, # 60, 2 po to start, then one po with each loose stool to max 6/ day. One refill authorized.  Polyp at time of colonoscopy was obvious, will recommend a f/u exam in five years.  Will have patient contacted by staff next week to see response to Lomotil.  If incomplete relief of daily AM loose BM, may need to consider small bowel study (without evidence of colitis on colonoscopy, unlikely enteric pathogens).

## 2014-05-19 ENCOUNTER — Encounter: Payer: Self-pay | Admitting: General Surgery

## 2014-05-20 ENCOUNTER — Encounter: Payer: Self-pay | Admitting: General Surgery

## 2014-05-20 ENCOUNTER — Encounter: Payer: Self-pay | Admitting: *Deleted

## 2014-05-20 NOTE — Telephone Encounter (Signed)
Please call 12/ 29 to see response to Lomotil for diarrhea. She states she had a normal BM yesterday. She is so glad and happy. She is taking one Lomotil at night. Aware to call for new concerns or issues. Place in recalls for colonoscopy in five years. Placed in [place]. Rosann Auerbach

## 2014-06-26 ENCOUNTER — Ambulatory Visit: Payer: Self-pay | Admitting: Oncology

## 2014-06-26 LAB — COMPREHENSIVE METABOLIC PANEL
ALT: 29 U/L (ref 14–63)
AST: 16 U/L (ref 15–37)
Albumin: 3.8 g/dL (ref 3.4–5.0)
Alkaline Phosphatase: 77 U/L (ref 46–116)
Anion Gap: 6 — ABNORMAL LOW (ref 7–16)
BUN: 12 mg/dL (ref 7–18)
Bilirubin,Total: 0.2 mg/dL (ref 0.2–1.0)
CALCIUM: 8.9 mg/dL (ref 8.5–10.1)
Chloride: 103 mmol/L (ref 98–107)
Co2: 33 mmol/L — ABNORMAL HIGH (ref 21–32)
Creatinine: 0.71 mg/dL (ref 0.60–1.30)
EGFR (Non-African Amer.): >60
GLUCOSE: 104 mg/dL — AB (ref 65–99)
Osmolality: 283 (ref 275–301)
POTASSIUM: 4.7 mmol/L (ref 3.5–5.1)
Sodium: 142 mmol/L (ref 136–145)
Total Protein: 7.4 g/dL (ref 6.4–8.2)

## 2014-06-26 LAB — CBC CANCER CENTER
BASOS ABS: 0.1 x10 3/mm (ref 0.0–0.1)
Basophil %: 0.9 %
Eosinophil #: 0.2 x10 3/mm (ref 0.0–0.7)
Eosinophil %: 2.7 %
HCT: 38.5 % (ref 35.0–47.0)
HGB: 12.9 g/dL (ref 12.0–16.0)
LYMPHS ABS: 2.4 x10 3/mm (ref 1.0–3.6)
Lymphocyte %: 30 %
MCH: 30.6 pg (ref 26.0–34.0)
MCHC: 33.4 g/dL (ref 32.0–36.0)
MCV: 92 fL (ref 80–100)
MONO ABS: 0.4 x10 3/mm (ref 0.2–0.9)
MONOS PCT: 5.4 %
Neutrophil #: 4.8 x10 3/mm (ref 1.4–6.5)
Neutrophil %: 61 %
Platelet: 248 x10 3/mm (ref 150–440)
RBC: 4.2 10*6/uL (ref 3.80–5.20)
RDW: 13.3 % (ref 11.5–14.5)
WBC: 7.9 x10 3/mm (ref 3.6–11.0)

## 2014-06-26 LAB — CREATININE, SERUM: CREATINE, SERUM: 0.71

## 2014-07-22 ENCOUNTER — Ambulatory Visit
Admit: 2014-07-22 | Disposition: A | Discharge status: Home / Self Care | Payer: Self-pay | Attending: Oncology | Admitting: Oncology

## 2014-09-05 ENCOUNTER — Other Ambulatory Visit: Payer: Self-pay | Admitting: Oncology

## 2014-09-05 DIAGNOSIS — C3490 Malignant neoplasm of unspecified part of unspecified bronchus or lung: Secondary | ICD-10-CM

## 2014-09-06 DIAGNOSIS — G4700 Insomnia, unspecified: Secondary | ICD-10-CM | POA: Insufficient documentation

## 2014-09-13 NOTE — Discharge Summary (Signed)
PATIENT NAME:  Natalie Stark, Natalie Stark MR#:  [id number] DATE OF BIRTH:  1960/11/05  DATE OF ADMISSION:  08/30/2013 DATE OF DISCHARGE:  09/06/2013  ADMITTING DIAGNOSIS: Left upper lobe mass.   DISCHARGE DIAGNOSIS: Left upper lobe mass. 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   HOSPITAL COURSE: Ms. Natalie Stark is a 54 year old white female who was recently diagnosed with a left upper lobe mass. She underwent a CT scan of the chest, which confirmed the presence of a large 2.5 cm left upper lobe mass and a smaller nodule in the left upper lobe. She was taken to the Operating Room on 08/30/2013, where she underwent a left thoracotomy and left upper lobectomy. The final pathology showed 2 separate adenocarcinomas in the lung, staging this as a T3 N0 M0. The patient recovered in the intensive care unit overnight and then was transferred to the floor. Her postoperative course was essentially unremarkable. She was able to ambulate without difficulties. Her pain was well controlled and she was discharged to home after her chest tubes were removed on 09/06/2013.   At the time of discharge, her medications included benazepril 40 mg once a day, estradiol 1 mg once a day, clonazepam 0.25 mg 1 tablet 3 times a day, quetiapine 200 mg once a day, oxycodone 7.5 mg every 4 hours as needed for pain, pantoprazole 40 mg once a day.   She was scheduled to follow up with Dr. Genevive Bi in 1 week and she will also see Dr. Oliva Bustard in the [place] in 1 week.    ____________________________ Lew Dawes. Genevive Bi, MD teo:lm D: 09/20/2013 17:20:42 ET T: 09/20/2013 20:05:40 ET JOB#: [id number]  cc: Lew Dawes. Genevive Bi, MD, <Dictator> Louis Matte MD ELECTRONICALLY SIGNED 09/24/2013 9:51

## 2014-09-13 NOTE — Consult Note (Signed)
PATIENT NAME:  Natalie Stark, Natalie Stark MR#:  [id number] DATE OF BIRTH:  [birth year]  DATE OF CONSULTATION:  08/30/2013  REFERRING PHYSICIAN:  Dr. Nestor Lewandowsky.  CONSULTING PHYSICIAN:  Vivien Presto, MD  PRIMARY CARE PHYSICIAN: Dr. Jeananne Rama.   REASON FOR CONSULTATION:  Medical management.  HISTORY OF PRESENT ILLNESS: The patient is a pleasant 54 year old Caucasian female with a long-time smoking history, who recently was found with left upper lobe mass thought to be bronchogenic cancer, who underwent left upper lobectomy earlier today per Dr. Genevive Bi and medicine was consulted for help with medical management. The patient currently is in recovery room. She has a history of hypertension, anxiety. She does not have any current shortness of breath or chest pains, but is complaining of some left shoulder pain. She denies having any falls. The patient states she has some pain at the left lung at the site of incision, but otherwise has no major complaints.   PAST MEDICAL HISTORY: Hypertension, anxiety, skin cancer, history of shoulder surgery with bone removed from her scapula, either early 1990s or late 1980s.   CURRENT MEDICATIONS: Benazepril 40 mg daily, clonazepam 1 mg 3 times a day as needed for anxiety, estradiol 1 mg once a day, quetiapine 200 mg once a day.   ALLERGIES: No known drug allergies.   SOCIAL HISTORY:  A 40+ pack-year smoker, quit recently.  Social alcohol. No drug use.   FAMILY HISTORY: Dad with heart issues. Daughter with kidney disease. Uncle with lung cancer, and 2 family members with thyroid cancer.     REVIEW OF SYSTEMS:   CONSTITUTIONAL: About 20 pound weight gain in the last 6 months. No fevers, chills.  EYES: No blurry vision or double vision.  ENT: No tinnitus or hearing loss. Some seasonal allergies. NECK: No lymph nodes in the neck.  CARDIOVASCULAR: Denies having any chest pains or congestive heart failure in the past. Has occasional lower extremity edema.  No significant  orthopnea.  RESPIRATORY: No cough, wheezing or significant shortness of breath. No hemoptysis.  ABDOMEN AND GASTROINTESTINAL: No nausea, vomiting, diarrhea, abdominal pain, black or tarry stools.  GENITOURINARY: Denies dysuria or hematuria.  HEMATOLOGIC AND LYMPHATIC: Denies anemia or easy bruising.  SKIN: Denies any rashes.  MUSCULOSKELETAL: Has chronic lower back pain and some left shoulder pain acutely.  NEUROLOGIC: No focal weakness or numbness.  PSYCHIATRIC: Has anxiety and insomnia.   PHYSICAL EXAMINATION: VITAL SIGNS: Temperature:  None. Currently postop, but on the monitor, pulse rate is in the 70s. Last blood pressure is 117/52. O2 sat 100% on room air. Respiratory rate about 20s to about 31.  GENERAL: The patient is a well-developed, obese female lying in bed, in no obvious distress, talking in full sentences.  HEENT: Normocephalic, atraumatic. Pupils are equal. Very dry mucous membranes.  NECK: Supple. No thyroid tenderness. No cervical lymphadenopathy.  CARDIOVASCULAR: S1, S2 regular. No significant murmurs appreciated.  LUNGS: Mild coarse wheezing on the left, but good air entry on both sides. The patient has left-sided chest tube laterally.  ABDOMEN: Soft, nontender, nondistended. Positive bowel sounds in all quadrants.  EXTREMITIES: No pitting edema.  NEUROLOGIC:  Unable to do a full neuro exam as the patient has a chest tube and epidural, but moves all of her extremities spontaneously. Has good strength had hand grip on bilateral upper extremities. Cranial nerves III through XII grossly intact. Sensation intact to light touch.  PSYCHIATRIC: Awake, alert, oriented x 3.   LABORATORIES AND IMAGING: From yesterday, glucose of 95, BUN 7,  creatinine 0.72, sodium 144, potassium 3.8, chloride 105. LFTs show AST of 13, otherwise within normal limits. White count of 8.3, hemoglobin 12.6. Platelets are 246. INR was 1 yesterday. ABG, earlier today, pH 7.43, pCO2 of 40, pO2 of 82. Yesterday,  chest, PA and lateral x-ray, 2 cm left upper lobe mass. No adenopathy. No pleural effusion. Right lung is clear.   ASSESSMENT AND PLAN: We have a pleasant 54 year old female with history of anxiety, hypertension, long-time smoker, who was found with a left upper lobe mass, who currently is in recovery room post left upper lobectomy, and medicine consulted for help with medical management.   In regards to her hypertension, I would recheck another BMP tomorrow. If kidney function is stable, I would resume her benazepril, and at this point, blood pressure appears to be stable and controlled. The patient does have an A-line as well for better measuring of the blood pressure at this point. I would continue the anxiety medications and quetiapine as is at this point. The patient appears to have significant anxiety issues as death of child with grandchildren in the past; that appears to be alleviated with clonazepam. I would recommend starting the patient on DVT prophylaxis once she is deemed acceptable bleeding risk by Dr. Genevive Bi. Would consider a left shoulder x-ray for the pain, although referred pain is possible to the left shoulder. She describes more of a joint pain. She has no recent falls or trauma.   Thank you for allowing [place] to follow with your patient, and I will follow along with you.   Total time spent is about 40 minutes.   ____________________________ Vivien Presto, MD sa:dmm D: 08/30/2013 12:16:30 ET T: 08/30/2013 12:37:16 ET JOB#: [id number]  cc: Vivien Presto, MD, <Dictator> Vivien Presto MD ELECTRONICALLY SIGNED 09/19/2013 14:29

## 2014-09-13 NOTE — Op Note (Signed)
PATIENT NAME:  Natalie Stark, Natalie Stark MR#:  [id number] DATE OF BIRTH:  1960/09/21  DATE OF PROCEDURE:  01/02/2014  PREOPERATIVE DIAGNOSES: Chronic cholecystitis and cholelithiasis.   POSTOPERATIVE DIAGNOSES: Chronic cholecystitis and cholelithiasis.   OPERATIVE PROCEDURE: Laparoscopic cholecystectomy with intraoperative cholangiograms.   SURGEON: Dr. Hervey Ard.   ANESTHESIA: General endotracheal under Dr. Kayleen Memos.   ESTIMATED BLOOD LOSS: 20 mL.   CLINICAL NOTE: This 54 year old woman who has recently completed therapy for lung cancer. She has had increasing episodes of nausea, vomiting, and abdominal pain. Ultrasound showed evidence of a thickened gallbladder wall and multiple stones. She is admitted at this time for elective cholecystectomy.   OPERATIVE NOTE: With the patient under adequate general endotracheal anesthesia, the abdomen was prepped with ChloraPrep and draped. In Trendelenburg position, a Veress needle was placed through a transumbilical incision. After assuring intra-abdominal location with the hanging drop test, the abdomen was insufflated with CO2 at 10 mmHg pressure. A 10-mm step port was expanded and inspection showed no evidence of injury from initial port placement. The patient was placed in reverse Trendelenburg position and rolled to the left. An orogastric tube was passed by the nurse anesthetist to deflate the stomach providing better exposure to the right upper quadrant. An 11-mm Xcel port was placed in the epigastrium and two 5-mm step ports placed laterally. The gallbladder was noted to have a Phrygian cap. It was placed on cephalad traction. Significant scarring near the neck of the gallbladder was identified and a markedly enlarged cystic duct lymph node encountered. The area was carefully dissected until the cystic duct could be cleared. A Kumar clamp was placed and fluoroscopic cholangiograms were completed using 6 mL of one-half strength Conray 60. This showed  prompt filling of the right and left hepatic ducts and free flow into the duodenum. The cystic duct and the anterior branch of the cystic artery were clipped. In the posterior branch, the cystic artery was identified and subsequently clipped and treated with cautery. The gallbladder was removed from the liver bed making use of hook cautery dissection. It was placed into an EndoCatch bag and then delivered through the umbilical port site. The umbilical incision was expanded to allow extraction of the multiple stones measuring up to 1.5 cm in diameter. After re-establishing pneumoperitoneum, the right upper quadrant was irrigated with lactated Ringer's solution and good hemostasis appreciated. The abdomen was then desufflated and ports removed under direct vision. The fascia at the umbilicus was closed with an 0 Vicryl figure-of-8 suture. Skin incisions were closed with 4-0 Vicryl subcuticular sutures. Benzoin, Steri-Strips, Telfa, and Tegaderm dressing was then applied.   The patient tolerated the procedure well and was taken to the recovery room in stable condition.    ____________________________ Robert Bellow, MD jwb:lt D: 01/02/2014 22:25:24 ET T: 01/02/2014 23:48:01 ET JOB#: [id number]  cc: Robert Bellow, MD, <Dictator> Guadalupe Maple, MD Martie Lee. Oliva Bustard, MD Ariea Rochin Amedeo Kinsman MD ELECTRONICALLY SIGNED 01/03/2014 16:38

## 2014-09-13 NOTE — Op Note (Signed)
PATIENT NAME:  RUHEE, ENCK MR#:  [id number] DATE OF BIRTH:  Jul 10, 1960  DATE OF PROCEDURE:  08/30/2013  SURGEON: Louis Matte, M.D.   ASSISTANT: Bronson Ing.   PREOPERATIVE DIAGNOSIS: Left upper lobe mass.  POSTOPERATIVE DIAGNOSIS: Left upper lobe mass.   OPERATIONS PERFORMED: 1.  Preoperative bronchoscopy to assess endobronchial anatomy.  2.  Left thoracotomy and left upper lobectomy with mediastinal lymphadenectomy.   INDICATIONS FOR PROCEDURE: Ms. Maciejewski is a 54 year old woman who was recently found to have 2 lesions in her left upper lobe which were positive. The indications and risks of the procedure were explained to the patient who gave her informed consent.   DESCRIPTION OF PROCEDURE: The patient was brought to the operating suite and placed in the supine position. General endotracheal anesthesia was given with a double-lumen tube. Preoperative bronchoscopy was carried out. This was normal to the subsegmental level bilaterally. The patient was then turned for a left thoracotomy. All pressure points were carefully padded. The patient was prepped and draped in the usual sterile fashion. A posterolateral fifth interspace thoracotomy was performed. The chest was entered. The inferior pulmonary ligament was divided. The mass was palpable in the upper lobe. There were 2 lesions present. We elected to perform a left upper lobectomy. An extensive mediastinal dissection in the subcarinal and AP window areas were performed. We began by opening up the fissure and identifying the branches to the upper lobe. We then encircled the superior pulmonary vein. We began by dividing the branches within the fissure to the upper lobe. We then took the superior pulmonary vein and then finally the remaining portions of the pulmonary artery. The only structure remaining was the bronchus and this was occluded. The lower lobe ventilated quite nicely. The bronchus was then transected. The chest was copiously  irrigated. There was no evidence of any bronchial air leak when tested under 30 cm of water pressure. Two chest tubes were inserted in standard fashion. These were brought out through separate stab wounds inferiorly. The thoracoscopy port was closed with interrupted Vicryl and nylon. The chest was then closed in the standard fashion using #2 Vicryl paracostal sutures. The muscles were closed with #2 Vicryl, the subcutaneous tissues with 2-0 Vicryl and the skin with skin clips. Sterile dressings were applied and the patient was then transported to the recovery room in stable condition.   ____________________________ Lew Dawes Genevive Bi, MD teo:ce D: 08/30/2013 16:55:37 ET T: 08/30/2013 19:17:31 ET JOB#: [id number]  cc: Christia Reading E. Genevive Bi, MD, <Dictator> Louis Matte MD ELECTRONICALLY SIGNED 09/20/2013 15:57

## 2014-09-15 LAB — SURGICAL PATHOLOGY

## 2014-09-19 ENCOUNTER — Other Ambulatory Visit: Payer: Self-pay | Admitting: *Deleted

## 2014-09-19 DIAGNOSIS — C3412 Malignant neoplasm of upper lobe, left bronchus or lung: Secondary | ICD-10-CM

## 2014-09-22 ENCOUNTER — Other Ambulatory Visit: Payer: Self-pay | Admitting: *Deleted

## 2014-09-22 DIAGNOSIS — C3490 Malignant neoplasm of unspecified part of unspecified bronchus or lung: Secondary | ICD-10-CM

## 2014-09-23 ENCOUNTER — Ambulatory Visit: Admission: RE | Admit: 2014-09-23 | Payer: Self-pay | Source: Ambulatory Visit

## 2014-09-25 ENCOUNTER — Ambulatory Visit: Payer: Self-pay | Admitting: Oncology

## 2014-09-25 ENCOUNTER — Other Ambulatory Visit: Payer: Self-pay

## 2014-09-29 ENCOUNTER — Other Ambulatory Visit: Payer: Self-pay | Admitting: *Deleted

## 2014-09-29 DIAGNOSIS — C3490 Malignant neoplasm of unspecified part of unspecified bronchus or lung: Secondary | ICD-10-CM

## 2014-09-30 ENCOUNTER — Ambulatory Visit
Admission: RE | Admit: 2014-09-30 | Discharge: 2014-09-30 | Disposition: A | Discharge status: Home / Self Care | Payer: BLUE CROSS/BLUE SHIELD | Source: Ambulatory Visit | Attending: Oncology | Admitting: Oncology

## 2014-09-30 DIAGNOSIS — C3490 Malignant neoplasm of unspecified part of unspecified bronchus or lung: Secondary | ICD-10-CM | POA: Diagnosis not present

## 2014-09-30 LAB — GLUCOSE, CAPILLARY: GLUCOSE-CAPILLARY: 97 mg/dL (ref 70–99)

## 2014-09-30 MED ORDER — FLUDEOXYGLUCOSE F - 18 (FDG) INJECTION
12.9000 | Freq: Once | INTRAVENOUS | Status: AC | PRN
Start: 2014-09-30 — End: 2014-09-30
  Administered 2014-09-30: 12.9 via INTRAVENOUS

## 2014-10-02 ENCOUNTER — Inpatient Hospital Stay: Payer: BLUE CROSS/BLUE SHIELD | Attending: Oncology

## 2014-10-02 ENCOUNTER — Inpatient Hospital Stay (HOSPITAL_BASED_OUTPATIENT_CLINIC_OR_DEPARTMENT_OTHER): Payer: BLUE CROSS/BLUE SHIELD | Admitting: Oncology

## 2014-10-02 VITALS — BP 114/80 | HR 85 | Temp 96.3°F | Wt 203.5 lb

## 2014-10-02 DIAGNOSIS — R0789 Other chest pain: Secondary | ICD-10-CM

## 2014-10-02 DIAGNOSIS — R5383 Other fatigue: Secondary | ICD-10-CM

## 2014-10-02 DIAGNOSIS — R911 Solitary pulmonary nodule: Secondary | ICD-10-CM | POA: Diagnosis not present

## 2014-10-02 DIAGNOSIS — R451 Restlessness and agitation: Secondary | ICD-10-CM

## 2014-10-02 DIAGNOSIS — Z79899 Other long term (current) drug therapy: Secondary | ICD-10-CM | POA: Insufficient documentation

## 2014-10-02 DIAGNOSIS — C3490 Malignant neoplasm of unspecified part of unspecified bronchus or lung: Secondary | ICD-10-CM

## 2014-10-02 DIAGNOSIS — Z90710 Acquired absence of both cervix and uterus: Secondary | ICD-10-CM | POA: Insufficient documentation

## 2014-10-02 DIAGNOSIS — F329 Major depressive disorder, single episode, unspecified: Secondary | ICD-10-CM

## 2014-10-02 DIAGNOSIS — Z808 Family history of malignant neoplasm of other organs or systems: Secondary | ICD-10-CM | POA: Insufficient documentation

## 2014-10-02 DIAGNOSIS — F418 Other specified anxiety disorders: Secondary | ICD-10-CM | POA: Diagnosis not present

## 2014-10-02 DIAGNOSIS — C3412 Malignant neoplasm of upper lobe, left bronchus or lung: Secondary | ICD-10-CM

## 2014-10-02 DIAGNOSIS — Z9221 Personal history of antineoplastic chemotherapy: Secondary | ICD-10-CM | POA: Insufficient documentation

## 2014-10-02 DIAGNOSIS — J449 Chronic obstructive pulmonary disease, unspecified: Secondary | ICD-10-CM

## 2014-10-02 DIAGNOSIS — F17210 Nicotine dependence, cigarettes, uncomplicated: Secondary | ICD-10-CM

## 2014-10-02 DIAGNOSIS — G479 Sleep disorder, unspecified: Secondary | ICD-10-CM | POA: Insufficient documentation

## 2014-10-02 DIAGNOSIS — Z85118 Personal history of other malignant neoplasm of bronchus and lung: Secondary | ICD-10-CM | POA: Insufficient documentation

## 2014-10-02 DIAGNOSIS — R635 Abnormal weight gain: Secondary | ICD-10-CM

## 2014-10-02 DIAGNOSIS — Z85828 Personal history of other malignant neoplasm of skin: Secondary | ICD-10-CM | POA: Diagnosis not present

## 2014-10-02 DIAGNOSIS — I10 Essential (primary) hypertension: Secondary | ICD-10-CM

## 2014-10-02 LAB — CBC WITH DIFFERENTIAL/PLATELET
Basophils Absolute: 0.1 10*3/uL (ref 0–0.1)
Basophils Relative: 1 %
Eosinophils Absolute: 0.2 10*3/uL (ref 0–0.7)
Eosinophils Relative: 3 %
HCT: 39.6 % (ref 35.0–47.0)
Hemoglobin: 13 g/dL (ref 12.0–16.0)
LYMPHS PCT: 38 %
Lymphs Abs: 2.6 10*3/uL (ref 1.0–3.6)
MCH: 30.1 pg (ref 26.0–34.0)
MCHC: 33 g/dL (ref 32.0–36.0)
MCV: 91.2 fL (ref 80.0–100.0)
MONOS PCT: 6 %
Monocytes Absolute: 0.4 10*3/uL (ref 0.2–0.9)
NEUTROS ABS: 3.7 10*3/uL (ref 1.4–6.5)
NEUTROS PCT: 52 %
Platelets: 259 10*3/uL (ref 150–440)
RBC: 4.34 MIL/uL (ref 3.80–5.20)
RDW: 13.3 % (ref 11.5–14.5)
WBC: 7.1 10*3/uL (ref 3.6–11.0)

## 2014-10-02 LAB — COMPREHENSIVE METABOLIC PANEL
ALK PHOS: 68 U/L (ref 38–126)
ALT: 28 U/L (ref 14–54)
ANION GAP: 8 (ref 5–15)
AST: 25 U/L (ref 15–41)
Albumin: 4.4 g/dL (ref 3.5–5.0)
BUN: 14 mg/dL (ref 6–20)
CALCIUM: 9.2 mg/dL (ref 8.9–10.3)
CO2: 26 mmol/L (ref 22–32)
Chloride: 104 mmol/L (ref 101–111)
Creatinine, Ser: 0.69 mg/dL (ref 0.44–1.00)
GLUCOSE: 101 mg/dL — AB (ref 65–99)
Potassium: 4.4 mmol/L (ref 3.5–5.1)
Sodium: 138 mmol/L (ref 135–145)
Total Bilirubin: 0.4 mg/dL (ref 0.3–1.2)
Total Protein: 7.7 g/dL (ref 6.5–8.1)

## 2014-10-03 ENCOUNTER — Encounter: Payer: Self-pay | Admitting: Oncology

## 2014-10-03 NOTE — Progress Notes (Signed)
[place] @ [facility] Telephone:([phone]) [phone]  Fax:([phone]) [place]: [birth year]  MR#: [id number]  BDZ#:[id number]  Patient Care Team: Guadalupe Maple, MD as PCP - General (Family Medicine) Forest Gleason, MD (Unknown Physician Specialty) Robert Bellow, MD (General Surgery)  CHIEF COMPLAINT:  Chief Complaint  Patient presents with  . Follow-up    Oncology History    Subjective: Chief Complaint/Diagnosis:   1.patient underwent left upper lobecectomy (April, 2015)  2 different nodules in same lung consistent with adenocarcinoma.   T3, N0, M0 tumor stage II a Visceral pleural involvement and 2 nodules in the same lobe of the lung 3 patient was resistered   for Master protocol under [facility] 4.started onadjuvant chemotherapy with cis-platinum and Alimta(may 11th, 2015) EGFR is   wild  type. no alk mutation      Lung cancer   12/18/2013 Initial Diagnosis Lung cancer    No flowsheet data found.  INTERVAL HISTORY: 54 year old lady who had a previous history of carcinoma of lung had a recent PET scan for restaging.  Patient came today extremely agitated and angry according to has gained 30 pounds of weight.  Patient is ready to divorce her husband. Her daughter  getting lady for kidney transplant.  According to patient's she  has not slept in the last 3 years and feeling exhausted  REVIEW OF SYSTEMS:   GENERAL:And is extremely angry.  Exhausted.  Depressed.HEENT:  No visual changes, runny nose, sore throat, mouth sores or tenderness. Lungs: No shortness of breath or cough.  No hemoptysis. Cardiac:  No chest pain, palpitations, orthopnea, or PND. GI:  No nausea, vomiting, diarrhea, constipation, melena or hematochezia. GU:  No urgency, frequency, dysuria, or hematuria. Musculoskeletal:  No back pain.  No joint pain.  No muscle tenderness. Extremities:  No pain or swelling. Skin:  No rashes or skin changes. Neuro:  No headache, numbness or weakness,  balance or coordination issues. Endocrine:  No diabetes, thyroid issues, hot flashes or night sweats. Psych: Angry.  Depressed Pain:  No focal pain. Continues to have is intermittent pain in the left chest wall area at the site of previous surgery. Review of systems:  All other systems reviewed and found to be negative.  As per HPI. Otherwise, a complete review of systems is negatve.  PAST MEDICAL HISTORY: Past Medical History  Diagnosis Date  . Cancer 08/30/13    Lung, tx removal of upper left lobe and chemotherapy  . Arthritis   . Insomnia   . Skin cancer   . Anxiety   . Hypertension     PAST SURGICAL HISTORY: Past Surgical History  Procedure Laterality Date  . Upper lobectomy Left   . Cesarean section    . Abdominal hysterectomy    . Cholecystectomy  01-02-14  . Colonoscopy  05-13-14    Dr Bary Castilla    FAMILY HISTORY Family History  Problem Relation Age of Onset  . Thyroid cancer Mother   . Thyroid cancer Brother          ADVANCED DIRECTIVES:  Does not have living will  HEALTH MAINTENANCE: History  Substance Use Topics  . Smoking status: Current Every Day Smoker -- 0.25 packs/day for 35 years    Types: Cigarettes    Last Attempt to Quit: 05/23/2013  . Smokeless tobacco: Never Used  . Alcohol Use: 0.0 oz/week    0 Standard drinks or equivalent per week      Allergies  Allergen Reactions  .  Seroquel [Quetiapine Fumarate] Other (See Comments)    hallunications    Current Outpatient Prescriptions  Medication Sig Dispense Refill  . benazepril (LOTENSIN) 40 MG tablet     . clonazePAM (KLONOPIN) 1 MG tablet     . estradiol (ESTRACE) 1 MG tablet     . loperamide (IMODIUM) 2 MG capsule Take by mouth daily.    Marland Kitchen omeprazole (PRILOSEC) 40 MG capsule Take 1 capsule (40 mg total) by mouth daily. 30 capsule 0  . oxycodone (OXY-IR) 5 MG capsule Take 5 mg by mouth every 6 (six) hours as needed for pain.    Marland Kitchen zolpidem (AMBIEN CR) 12.5 MG CR tablet Take 12.5 mg by  mouth at bedtime as needed for sleep.    . cholestyramine (QUESTRAN) 4 GM/DOSE powder Take 1 packet (4 g total) by mouth daily. (Patient not taking: Reported on 10/02/2014) 378 g 12  . citalopram (CELEXA) 20 MG tablet Take 20 mg by mouth daily.    Marland Kitchen lamoTRIgine (LAMICTAL) 25 MG tablet Take 25 mg by mouth daily.    Marland Kitchen lurasidone (LATUDA) 40 MG TABS tablet Take 40 mg by mouth daily with breakfast.    . NUCYNTA 50 MG TABS tablet Take 50 mg by mouth every 4 (four) hours as needed.     . polyethylene glycol powder (GLYCOLAX/MIRALAX) powder 255 grams one bottle for colonoscopy prep (Patient not taking: Reported on 10/02/2014) 255 g 0   No current facility-administered medications for this visit.    OBJECTIVE:  Filed Vitals:   10/02/14 1129  BP: 114/80  Pulse: 85  Temp: 96.3 F (35.7 C)     Body mass index is 37.21 kg/(m^2).    ECOG FS:1 - Symptomatic but completely ambulatory  PHYSICAL EXAM: GENERAL:  Well developed, well nourished, sitting comfortably in the exam room in no acute distress. MENTAL STATUS:  Alert and oriented to person, place and time. HEAD: Normocephalic, atraumatic, face symmetric, no Cushingoid features. EYES: .  Pupils equal round and reactive to light and accomodation.  No conjunctivitis or scleral icterus. ENT:  Oropharynx clear without lesion.  Tongue normal. Mucous membranes moist.  RESPIRATORY:  Clear to auscultation without rales, wheezes or rhonchi. CARDIOVASCULAR:  Regular rate and rhythm without murmur, rub or gallop. BREAST:  Right breast without masses, skin changes or nipple discharge.  Left breast without masses, skin changes or nipple discharge. ABDOMEN:  Soft, non-tender, with active bowel sounds, and no hepatosplenomegaly.  No masses. BACK:  No CVA tenderness.  No tenderness on percussion of the back or rib cage. SKIN:  No rashes, ulcers or lesions. EXTREMITIES: No edema, no skin discoloration or tenderness.  No palpable cords. LYMPH NODES: No palpable  cervical, supraclavicular, axillary or inguinal adenopathy  NEUROLOGICAL: Unremarkable. PSYCH:  Angry and depressed. LAB RESULTS:  Appointment on 10/02/2014  Component Date Value Ref Range Status  . WBC 10/02/2014 7.1  3.6 - 11.0 K/uL Final  . RBC 10/02/2014 4.34  3.80 - 5.20 MIL/uL Final  . Hemoglobin 10/02/2014 13.0  12.0 - 16.0 g/dL Final  . HCT 10/02/2014 39.6  35.0 - 47.0 % Final  . MCV 10/02/2014 91.2  80.0 - 100.0 fL Final  . MCH 10/02/2014 30.1  26.0 - 34.0 pg Final  . MCHC 10/02/2014 33.0  32.0 - 36.0 g/dL Final  . RDW 10/02/2014 13.3  11.5 - 14.5 % Final  . Platelets 10/02/2014 259  150 - 440 K/uL Final  . Neutrophils Relative % 10/02/2014 52   Final  .  Neutro Abs 10/02/2014 3.7  1.4 - 6.5 K/uL Final  . Lymphocytes Relative 10/02/2014 38   Final  . Lymphs Abs 10/02/2014 2.6  1.0 - 3.6 K/uL Final  . Monocytes Relative 10/02/2014 6   Final  . Monocytes Absolute 10/02/2014 0.4  0.2 - 0.9 K/uL Final  . Eosinophils Relative 10/02/2014 3   Final  . Eosinophils Absolute 10/02/2014 0.2  0 - 0.7 K/uL Final  . Basophils Relative 10/02/2014 1   Final  . Basophils Absolute 10/02/2014 0.1  0 - 0.1 K/uL Final  . Sodium 10/02/2014 138  135 - 145 mmol/L Final  . Potassium 10/02/2014 4.4  3.5 - 5.1 mmol/L Final  . Chloride 10/02/2014 104  101 - 111 mmol/L Final  . CO2 10/02/2014 26  22 - 32 mmol/L Final  . Glucose, Bld 10/02/2014 101* 65 - 99 mg/dL Final  . BUN 10/02/2014 14  6 - 20 mg/dL Final  . Creatinine, Ser 10/02/2014 0.69  0.44 - 1.00 mg/dL Final  . Calcium 10/02/2014 9.2  8.9 - 10.3 mg/dL Final  . Total Protein 10/02/2014 7.7  6.5 - 8.1 g/dL Final  . Albumin 10/02/2014 4.4  3.5 - 5.0 g/dL Final  . AST 10/02/2014 25  15 - 41 U/L Final  . ALT 10/02/2014 28  14 - 54 U/L Final  . Alkaline Phosphatase 10/02/2014 68  38 - 126 U/L Final  . Total Bilirubin 10/02/2014 0.4  0.3 - 1.2 mg/dL Final  . GFR calc non Af Amer 10/02/2014 >60  >60 mL/min Final  . GFR calc Af Amer 10/02/2014  >60  >60 mL/min Final   Comment: (NOTE) The eGFR has been calculated using the CKD EPI equation. This calculation has not been validated in all clinical situations. eGFR's persistently <60 mL/min signify possible Chronic Kidney Disease.   Georgiann Hahn gap 10/02/2014 8  5 - 15 Final  Hospital Outpatient Visit on 09/30/2014  Component Date Value Ref Range Status  . Glucose-Capillary 09/30/2014 97  70 - 99 mg/dL Final      STUDIES: Nm Pet Image Restag (ps) Skull Base To Thigh  09/30/2014   CLINICAL DATA:  Subsequent treatment strategy for lung cancer post chemotherapy completed 1 year ago.  EXAM: NUCLEAR MEDICINE PET SKULL BASE TO THIGH  TECHNIQUE: 12.9 mCi F-18 FDG was injected intravenously. Full-ring PET imaging was performed from the skull base to thigh after the radiotracer. CT data was obtained and used for attenuation correction and anatomic localization.  FASTING BLOOD GLUCOSE:  Value: 97 mg/dl  COMPARISON:  PET-CT 02/05/2014 and 07/17/2013.  FINDINGS: NECK  No hypermetabolic cervical lymph nodes are identified.There are no lesions of the pharyngeal mucosal space.  CHEST  There are no hypermetabolic mediastinal, hilar or axillary lymph nodes. There are stable postsurgical changes related to left upper lobe resection. There is a stable 3 mm subpleural nodule in the right upper lobe on image 73. No hypermetabolic pulmonary activity demonstrated.  ABDOMEN/PELVIS  There is no hypermetabolic activity within the liver, adrenal glands, spleen or pancreas. There is no hypermetabolic nodal activity. There is a stable tiny nonobstructing calculus in the upper pole of the right kidney on image 159. 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 Within the left adnexa, there is a 3.2 cm cystic lesion on image 216 which does not demonstrate any abnormal metabolic activity. This is unchanged from 07/17/2013. Patient is status post hysterectomy.  SKELETON  There is no  hypermetabolic activity to suggest osseous metastatic disease.  IMPRESSION: 1. No evidence of local recurrence or metastatic lung cancer. 2. Stable postsurgical changes status post left upper lobe resection. Stable small subpleural right upper lobe nodule. 3. Persistent cystic lesion in the left adnexa without abnormal metabolic activity. The persistence argues against a functional cyst. This could reflect a postoperative finding. Pelvic ultrasound correlation may be helpful.   Electronically Signed   By: Richardean Sale M.D.   On: 09/30/2014 11:31    ASSESSMENT: Carcinoma of lung left upper lobe.  Adenocarcinoma.  EGFR and IALK will not mutated. 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 Discussed possibility of quitting smoking but patient at this point I am is extremely depressed and angry.  Demanded to get a new sleeping pill which have not overwear off any medication.  Patient says Ambien and other medication prescribed in the past did not work.  I asked her to review her symptoms with her psychiatrIST.  And according to her he has refused to prescribe any sleeping pill.  Patient is also being taken care by pain clinic in [place] and they are prescribing number of pain medication.  I asked her to check with her primary care physician regarding new sleeping pill but according to her she does not want to go and see her primary care physician at present time.  Finally she got extremely angry through second start of and walked out cursing and using profanity. We will mail her next appointment in 6 month for evaluation regarding lung cancer  Patient expressed understanding and was in agreement with this plan. She also understands that She can call clinic at any time with any questions, concerns, or complaints.     Lung cancer   Staging form: Lung, AJCC 7th Edition     Clinical: Stage IIB (yT3, N0, M0) - Marni Griffon, MD   10/03/2014 11:56 AM

## 2014-11-04 ENCOUNTER — Ambulatory Visit (INDEPENDENT_AMBULATORY_CARE_PROVIDER_SITE_OTHER): Payer: BLUE CROSS/BLUE SHIELD | Admitting: Family Medicine

## 2014-11-04 ENCOUNTER — Encounter (INDEPENDENT_AMBULATORY_CARE_PROVIDER_SITE_OTHER): Payer: Self-pay

## 2014-11-04 ENCOUNTER — Encounter: Payer: Self-pay | Admitting: Family Medicine

## 2014-11-04 VITALS — BP 126/77 | HR 86 | Temp 97.9°F | Ht 62.2 in | Wt 205.0 lb

## 2014-11-04 DIAGNOSIS — G4700 Insomnia, unspecified: Secondary | ICD-10-CM | POA: Diagnosis not present

## 2014-11-04 DIAGNOSIS — I10 Essential (primary) hypertension: Secondary | ICD-10-CM

## 2014-11-04 MED ORDER — TRAZODONE HCL 50 MG PO TABS: 25.0000 mg | ORAL_TABLET | Freq: Every evening | ORAL | Status: DC | PRN

## 2014-11-04 MED ORDER — BENAZEPRIL HCL 40 MG PO TABS: 40.0000 mg | ORAL_TABLET | Freq: Every day | ORAL | Status: DC

## 2014-11-04 MED ORDER — ESTRADIOL 1 MG PO TABS: 1.0000 mg | ORAL_TABLET | Freq: Every day | ORAL | Status: DC

## 2014-11-04 NOTE — Progress Notes (Signed)
BP 126/77 mmHg  Pulse 86  Temp(Src) 97.9 F (36.6 C)  Ht 5' 2.2" (1.58 m)  Wt 205 lb (92.987 kg)  BMI 37.25 kg/m2  SpO2 99%   Subjective:    Patient ID: Kathlene November, female    DOB: 11-Feb-1961, 54 y.o.   MRN: [id number]  HPI: DEMI TRIEU is a 54 y.o. female  Chief Complaint  Patient presents with  . Insomnia  . Obesity   concerned wt goes up and down 20 lbs is trying to diet some psy is reviewing sleep BP doing well with good control and meds    Relevant past medical, surgical, family and social history reviewed and updated as indicated. Interim medical history since our last visit reviewed. Allergies and medications reviewed and updated.  Review of Systems  Constitutional: Negative.   Respiratory: Negative.   Cardiovascular: Negative.     Per HPI unless specifically indicated above     Objective:    BP 126/77 mmHg  Pulse 86  Temp(Src) 97.9 F (36.6 C)  Ht 5' 2.2" (1.58 m)  Wt 205 lb (92.987 kg)  BMI 37.25 kg/m2  SpO2 99%  Wt Readings from Last 3 Encounters:  11/04/14 205 lb (92.987 kg)  10/02/14 203 lb 7.8 oz (92.3 kg)  09/30/14 203 lb (92.08 kg)    Physical Exam  Constitutional: She is oriented to person, place, and time. She appears well-developed and well-nourished. No distress.  HENT:  Head: Normocephalic and atraumatic.  Right Ear: Hearing normal.  Left Ear: Hearing normal.  Nose: Nose normal.  Eyes: Conjunctivae and lids are normal. Right eye exhibits no discharge. Left eye exhibits no discharge. No scleral icterus.  Cardiovascular: Normal rate, regular rhythm and normal heart sounds.   Pulmonary/Chest: Effort normal and breath sounds normal. No respiratory distress.  Musculoskeletal: Normal range of motion. She exhibits no edema.  Neurological: She is alert and oriented to person, place, and time.  Skin: Skin is intact. No rash noted.  Psychiatric: She has a normal mood and affect. Her speech is normal and behavior is normal.  Judgment and thought content normal. Cognition and memory are normal.    Results for orders placed or performed in visit on 10/02/14  CBC with Differential  Result Value Ref Range   WBC 7.1 3.6 - 11.0 K/uL   RBC 4.34 3.80 - 5.20 MIL/uL   Hemoglobin 13.0 12.0 - 16.0 g/dL   HCT 39.6 35.0 - 47.0 %   MCV 91.2 80.0 - 100.0 fL   MCH 30.1 26.0 - 34.0 pg   MCHC 33.0 32.0 - 36.0 g/dL   RDW 13.3 11.5 - 14.5 %   Platelets 259 150 - 440 K/uL   Neutrophils Relative % 52 %   Neutro Abs 3.7 1.4 - 6.5 K/uL   Lymphocytes Relative 38 %   Lymphs Abs 2.6 1.0 - 3.6 K/uL   Monocytes Relative 6 %   Monocytes Absolute 0.4 0.2 - 0.9 K/uL   Eosinophils Relative 3 %   Eosinophils Absolute 0.2 0 - 0.7 K/uL   Basophils Relative 1 %   Basophils Absolute 0.1 0 - 0.1 K/uL  Comprehensive metabolic panel  Result Value Ref Range   Sodium 138 135 - 145 mmol/L   Potassium 4.4 3.5 - 5.1 mmol/L   Chloride 104 101 - 111 mmol/L   CO2 26 22 - 32 mmol/L   Glucose, Bld 101 (H) 65 - 99 mg/dL   BUN 14 6 - 20 mg/dL  Creatinine, Ser 0.69 0.44 - 1.00 mg/dL   Calcium 9.2 8.9 - 10.3 mg/dL   Total Protein 7.7 6.5 - 8.1 g/dL   Albumin 4.4 3.5 - 5.0 g/dL   AST 25 15 - 41 U/L   ALT 28 14 - 54 U/L   Alkaline Phosphatase 68 38 - 126 U/L   Total Bilirubin 0.4 0.3 - 1.2 mg/dL   GFR calc non Af Amer >60 >60 mL/min   GFR calc Af Amer >60 >60 mL/min   Anion gap 8 5 - 15      Assessment & Plan:  Discussed up down weight and wt loss Problem List Items Addressed This Visit      Cardiovascular and Mediastinum   Hypertension - Primary (Chronic)    The current medical regimen is effective;  continue present plan and medications.       Relevant Medications   benazepril (LOTENSIN) 40 MG tablet   Other Relevant Orders   Urinalysis, Routine w reflex microscopic (not at [facility])   TSH   Lipid panel   Comprehensive metabolic panel   CBC with Differential/Platelet     Other   Insomnia    Discussed care and tx will start  trazadone      Relevant Orders   Urinalysis, Routine w reflex microscopic (not at [facility])   TSH   Lipid panel   Comprehensive metabolic panel   CBC with Differential/Platelet       Follow up plan: Return this summer, for Physical Exam.

## 2014-11-04 NOTE — Assessment & Plan Note (Signed)
Discussed care and tx will start trazadone

## 2014-11-04 NOTE — Assessment & Plan Note (Signed)
The current medical regimen is effective;  continue present plan and medications.  

## 2014-11-05 ENCOUNTER — Telehealth: Payer: Self-pay

## 2014-11-05 NOTE — Telephone Encounter (Signed)
Pt given note not taking klonopin or Ambien for pain clinic

## 2014-12-09 ENCOUNTER — Ambulatory Visit (INDEPENDENT_AMBULATORY_CARE_PROVIDER_SITE_OTHER): Payer: BLUE CROSS/BLUE SHIELD | Admitting: Family Medicine

## 2014-12-09 ENCOUNTER — Encounter: Payer: Self-pay | Admitting: Family Medicine

## 2014-12-09 VITALS — BP 130/81 | HR 114 | Temp 99.1°F | Ht 61.8 in | Wt 200.0 lb

## 2014-12-09 DIAGNOSIS — N951 Menopausal and female climacteric states: Secondary | ICD-10-CM

## 2014-12-09 DIAGNOSIS — Z0000 Encounter for general adult medical examination without abnormal findings: Secondary | ICD-10-CM

## 2014-12-09 DIAGNOSIS — I10 Essential (primary) hypertension: Secondary | ICD-10-CM | POA: Diagnosis not present

## 2014-12-09 DIAGNOSIS — C3412 Malignant neoplasm of upper lobe, left bronchus or lung: Secondary | ICD-10-CM

## 2014-12-09 MED ORDER — OMEPRAZOLE 40 MG PO CPDR: 40.0000 mg | DELAYED_RELEASE_CAPSULE | Freq: Every day | ORAL | Status: AC

## 2014-12-09 MED ORDER — VARENICLINE TARTRATE 0.5 MG X 11 & 1 MG X 42 PO MISC: ORAL | Status: DC

## 2014-12-09 MED ORDER — BENAZEPRIL HCL 40 MG PO TABS: 40.0000 mg | ORAL_TABLET | Freq: Every day | ORAL | Status: DC

## 2014-12-09 MED ORDER — ESTRADIOL 1 MG PO TABS: 1.0000 mg | ORAL_TABLET | Freq: Every day | ORAL | Status: DC

## 2014-12-09 MED ORDER — VARENICLINE TARTRATE 1 MG PO TABS: 1.0000 mg | ORAL_TABLET | Freq: Two times a day (BID) | ORAL | Status: DC

## 2014-12-09 NOTE — Assessment & Plan Note (Signed)
Stable followed by cancer Center and through with chemotherapy.

## 2014-12-09 NOTE — Assessment & Plan Note (Signed)
The current medical regimen is effective;  continue present plan and medications.  

## 2014-12-09 NOTE — Progress Notes (Signed)
BP 130/81 mmHg  Pulse 114  Temp(Src) 99.1 F (37.3 C)  Ht 5' 1.8" (1.57 m)  Wt 200 lb (90.719 kg)  BMI 36.80 kg/m2  SpO2 97%   Subjective:    Patient ID: Natalie Stark, female    DOB: 1960/12/08, 54 y.o.   MRN: [id number]  HPI: Natalie Stark is a 54 y.o. female  Chief Complaint  Patient presents with  . Annual Exam   patient follow-up for physical has been doing well reports from [place] stable Menopausal was being controlled on estradiol 1 mg a day until started chemotherapy but all in all it's okay. Blood pressure doing okay well controlled on benazepril Reflux stable, sleep stable on trazodone, and pain being managed by pain specialists and using oxycodone.  Patient otherwise doing well Patient interested in quitting smoking especially with current diagnosis of lung cancer. Knows that we'll have to stop trazodone and wants to stop that which will be discontinued.   Relevant past medical, surgical, family and social history reviewed and updated as indicated. Interim medical history since our last visit reviewed. Allergies and medications reviewed and updated.  Review of Systems  Constitutional: Negative.   HENT: Negative.   Eyes: Negative.   Respiratory: Negative.   Cardiovascular: Negative.   Gastrointestinal: Negative.   Endocrine: Negative.   Genitourinary: Negative.   Musculoskeletal: Negative.   Skin: Negative.   Allergic/Immunologic: Negative.   Neurological: Negative.   Hematological: Negative.   Psychiatric/Behavioral: Negative.     Per HPI unless specifically indicated above     Objective:    BP 130/81 mmHg  Pulse 114  Temp(Src) 99.1 F (37.3 C)  Ht 5' 1.8" (1.57 m)  Wt 200 lb (90.719 kg)  BMI 36.80 kg/m2  SpO2 97%  Wt Readings from Last 3 Encounters:  12/09/14 200 lb (90.719 kg)  11/04/14 205 lb (92.987 kg)  10/02/14 203 lb 7.8 oz (92.3 kg)    Physical Exam  Constitutional: She is oriented to person, place, and time. She  appears well-developed and well-nourished.  HENT:  Head: Normocephalic and atraumatic.  Right Ear: External ear normal.  Left Ear: External ear normal.  Nose: Nose normal.  Mouth/Throat: Oropharynx is clear and moist.  Eyes: Conjunctivae and EOM are normal. Pupils are equal, round, and reactive to light.  Neck: Normal range of motion. Neck supple. Carotid bruit is not present.  Cardiovascular: Normal rate, regular rhythm and normal heart sounds.   No murmur heard. Pulmonary/Chest: Effort normal and breath sounds normal. Right breast exhibits no mass and no tenderness. Left breast exhibits no mass and no tenderness. Breasts are symmetrical.  Abdominal: Soft. Bowel sounds are normal. There is no hepatosplenomegaly.  Musculoskeletal: Normal range of motion.  Neurological: She is alert and oriented to person, place, and time.  Skin: No rash noted.  Psychiatric: She has a normal mood and affect. Her behavior is normal. Judgment and thought content normal.    Results for orders placed or performed in visit on 10/02/14  CBC with Differential  Result Value Ref Range   WBC 7.1 3.6 - 11.0 K/uL   RBC 4.34 3.80 - 5.20 MIL/uL   Hemoglobin 13.0 12.0 - 16.0 g/dL   HCT 39.6 35.0 - 47.0 %   MCV 91.2 80.0 - 100.0 fL   MCH 30.1 26.0 - 34.0 pg   MCHC 33.0 32.0 - 36.0 g/dL   RDW 13.3 11.5 - 14.5 %   Platelets 259 150 - 440 K/uL   Neutrophils  Relative % 52 %   Neutro Abs 3.7 1.4 - 6.5 K/uL   Lymphocytes Relative 38 %   Lymphs Abs 2.6 1.0 - 3.6 K/uL   Monocytes Relative 6 %   Monocytes Absolute 0.4 0.2 - 0.9 K/uL   Eosinophils Relative 3 %   Eosinophils Absolute 0.2 0 - 0.7 K/uL   Basophils Relative 1 %   Basophils Absolute 0.1 0 - 0.1 K/uL  Comprehensive metabolic panel  Result Value Ref Range   Sodium 138 135 - 145 mmol/L   Potassium 4.4 3.5 - 5.1 mmol/L   Chloride 104 101 - 111 mmol/L   CO2 26 22 - 32 mmol/L   Glucose, Bld 101 (H) 65 - 99 mg/dL   BUN 14 6 - 20 mg/dL   Creatinine, Ser  0.69 0.44 - 1.00 mg/dL   Calcium 9.2 8.9 - 10.3 mg/dL   Total Protein 7.7 6.5 - 8.1 g/dL   Albumin 4.4 3.5 - 5.0 g/dL   AST 25 15 - 41 U/L   ALT 28 14 - 54 U/L   Alkaline Phosphatase 68 38 - 126 U/L   Total Bilirubin 0.4 0.3 - 1.2 mg/dL   GFR calc non Af Amer >60 >60 mL/min   GFR calc Af Amer >60 >60 mL/min   Anion gap 8 5 - 15      Assessment & Plan:   Problem List Items Addressed This Visit      Cardiovascular and Mediastinum   Hypertension - Primary (Chronic)    The current medical regimen is effective;  continue present plan and medications.       Relevant Medications   benazepril (LOTENSIN) 40 MG tablet   Other Relevant Orders   Basic metabolic panel     Respiratory   Lung cancer    Stable followed by cancer Center and through with chemotherapy.      Relevant Medications   varenicline (CHANTIX STARTING MONTH PAK) 0.5 MG X 11 & 1 MG X 42 tablet   varenicline (CHANTIX CONTINUING MONTH PAK) 1 MG tablet     Other   Menopausal flushing   Relevant Medications   estradiol (ESTRACE) 1 MG tablet    Other Visit Diagnoses    Annual physical exam        Relevant Orders    CBC with Differential/Platelet    Comprehensive metabolic panel    Urinalysis, Routine w reflex microscopic (not at [facility])    TSH    Lipid panel        Follow up plan: Return in about 6 months (around 06/11/2015), or if symptoms worsen or fail to improve, for recheck meds.

## 2014-12-10 LAB — COMPREHENSIVE METABOLIC PANEL
ALT: 14 IU/L (ref 0–32)
AST: 14 IU/L (ref 0–40)
Albumin/Globulin Ratio: 1.6 (ref 1.1–2.5)
Albumin: 4.3 g/dL (ref 3.5–5.5)
Alkaline Phosphatase: 69 IU/L (ref 39–117)
BUN/Creatinine Ratio: 16 (ref 9–23)
BUN: 12 mg/dL (ref 6–24)
Bilirubin Total: <0.2 mg/dL (ref 0.0–1.2)
CALCIUM: 9.6 mg/dL (ref 8.7–10.2)
CO2: 25 mmol/L (ref 18–29)
CREATININE: 0.74 mg/dL (ref 0.57–1.00)
Chloride: 98 mmol/L (ref 97–108)
GFR, EST AFRICAN AMERICAN: 108 mL/min/{1.73_m2} (ref >59)
GFR, EST NON AFRICAN AMERICAN: 94 mL/min/{1.73_m2} (ref >59)
GLUCOSE: 106 mg/dL — AB (ref 65–99)
Globulin, Total: 2.7 g/dL (ref 1.5–4.5)
POTASSIUM: 4.4 mmol/L (ref 3.5–5.2)
Sodium: 141 mmol/L (ref 134–144)
Total Protein: 7 g/dL (ref 6.0–8.5)

## 2014-12-10 LAB — CBC WITH DIFFERENTIAL/PLATELET
BASOS ABS: 0 10*3/uL (ref 0.0–0.2)
Basos: 0 %
EOS (ABSOLUTE): 0.1 10*3/uL (ref 0.0–0.4)
EOS: 1 %
Hematocrit: 37.5 % (ref 34.0–46.6)
Hemoglobin: 12.5 g/dL (ref 11.1–15.9)
Immature Grans (Abs): 0 10*3/uL (ref 0.0–0.1)
Immature Granulocytes: 0 %
LYMPHS: 30 %
Lymphocytes Absolute: 3 10*3/uL (ref 0.7–3.1)
MCH: 30.3 pg (ref 26.6–33.0)
MCHC: 33.3 g/dL (ref 31.5–35.7)
MCV: 91 fL (ref 79–97)
MONOCYTES: 6 %
MONOS ABS: 0.6 10*3/uL (ref 0.1–0.9)
NEUTROS PCT: 63 %
Neutrophils Absolute: 6.3 10*3/uL (ref 1.4–7.0)
PLATELETS: 291 10*3/uL (ref 150–379)
RBC: 4.12 x10E6/uL (ref 3.77–5.28)
RDW: 13.4 % (ref 12.3–15.4)
WBC: 10.1 10*3/uL (ref 3.4–10.8)

## 2014-12-10 LAB — LIPID PANEL
Chol/HDL Ratio: 3.9 ratio units (ref 0.0–4.4)
Cholesterol, Total: 245 mg/dL — ABNORMAL HIGH (ref 100–199)
HDL: 63 mg/dL (ref >39)
LDL Calculated: 147 mg/dL — ABNORMAL HIGH (ref 0–99)
TRIGLYCERIDES: 177 mg/dL — AB (ref 0–149)
VLDL Cholesterol Cal: 35 mg/dL (ref 5–40)

## 2014-12-10 LAB — URINALYSIS, ROUTINE W REFLEX MICROSCOPIC
Bilirubin, UA: NEGATIVE
GLUCOSE, UA: NEGATIVE
Ketones, UA: NEGATIVE
Leukocytes, UA: NEGATIVE
NITRITE UA: NEGATIVE
PROTEIN UA: NEGATIVE
SPEC GRAV UA: 1.03 (ref 1.005–1.030)
Urobilinogen, Ur: 0.2 mg/dL (ref 0.2–1.0)
pH, UA: 5 (ref 5.0–7.5)

## 2014-12-10 LAB — MICROSCOPIC EXAMINATION: WBC UA: NONE SEEN /HPF (ref 0–?)

## 2014-12-10 LAB — TSH: TSH: 1.07 u[IU]/mL (ref 0.450–4.500)

## 2014-12-10 NOTE — Progress Notes (Signed)
Phone call Discussed with patient elevated lipids She needs to do better with diet and exercise nutrition we'll recheck lipid panel next office visit

## 2015-03-02 ENCOUNTER — Encounter (INDEPENDENT_AMBULATORY_CARE_PROVIDER_SITE_OTHER): Payer: Self-pay

## 2015-03-02 ENCOUNTER — Encounter: Payer: Self-pay | Admitting: Cardiovascular Disease

## 2015-03-02 ENCOUNTER — Ambulatory Visit (INDEPENDENT_AMBULATORY_CARE_PROVIDER_SITE_OTHER): Payer: BLUE CROSS/BLUE SHIELD | Admitting: Cardiovascular Disease

## 2015-03-02 VITALS — BP 120/80 | HR 82 | Ht 62.0 in | Wt 203.2 lb

## 2015-03-02 DIAGNOSIS — R079 Chest pain, unspecified: Secondary | ICD-10-CM

## 2015-03-02 DIAGNOSIS — R002 Palpitations: Secondary | ICD-10-CM

## 2015-03-02 DIAGNOSIS — R0602 Shortness of breath: Secondary | ICD-10-CM | POA: Diagnosis not present

## 2015-03-02 DIAGNOSIS — R0789 Other chest pain: Secondary | ICD-10-CM

## 2015-03-02 NOTE — Patient Instructions (Signed)
Medication Instructions:  Your physician recommends that you continue on your current medications as directed. Please refer to the Current Medication list given to you today.   Labwork: none  Testing/Procedures: Your physician has recommended that you wear a holter monitor. Holter monitors are medical devices that record the heart's electrical activity. Doctors most often use these monitors to diagnose arrhythmias. Arrhythmias are problems with the speed or rhythm of the heartbeat. The monitor is a small, portable device. You can wear one while you do your normal daily activities. This is usually used to diagnose what is causing palpitations/syncope (passing out).    Follow-Up: Your physician wants you to follow-up in: six months with Dr. Vilinda Boehringer will receive a reminder letter in the mail two months in advance. If you don't receive a letter, please call our office to schedule the follow-up appointment.   Any Other Special Instructions Will Be Listed Below (If Applicable).  Cardiac Event Monitoring A cardiac event monitor is a small recording device used to help detect abnormal heart rhythms (arrhythmias). The monitor is used to record heart rhythm when noticeable symptoms such as the following occur:  Fast heartbeats (palpitations), such as heart racing or fluttering.  Dizziness.  Fainting or light-headedness.  Unexplained weakness. The monitor is wired to two electrodes placed on your chest. Electrodes are flat, sticky disks that attach to your skin. The monitor can be worn for up to 30 days. You will wear the monitor at all times, except when bathing.  HOW TO USE YOUR CARDIAC EVENT MONITOR A technician will prepare your chest for the electrode placement. The technician will show you how to place the electrodes, how to work the monitor, and how to replace the batteries. Take time to practice using the monitor before you leave the office. Make sure you understand how to send the  information from the monitor to your health care provider. This requires a telephone with a landline, not a cell phone. You need to:  Wear your monitor at all times, except when you are in water:  Do not get the monitor wet.  Take the monitor off when bathing. Do not swim or use a hot tub with it on.  Keep your skin clean. Do not put body lotion or moisturizer on your chest.  Change the electrodes daily or any time they stop sticking to your skin. You might need to use tape to keep them on.  It is possible that your skin under the electrodes could become irritated. To keep this from happening, try to put the electrodes in slightly different places on your chest. However, they must remain in the area under your left breast and in the upper right section of your chest.  Make sure the monitor is safely clipped to your clothing or in a location close to your body that your health care provider recommends.  Press the button to record when you feel symptoms of heart trouble, such as dizziness, weakness, light-headedness, palpitations, thumping, shortness of breath, unexplained weakness, or a fluttering or racing heart. The monitor is always on and records what happened slightly before you pressed the button, so do not worry about being too late to get good information.  Keep a diary of your activities, such as walking, doing chores, and taking medicine. It is especially important to note what you were doing when you pushed the button to record your symptoms. This will help your health care provider determine what might be contributing to your symptoms. The  information stored in your monitor will be reviewed by your health care provider alongside your diary entries.  Send the recorded information as recommended by your health care provider. It is important to understand that it will take some time for your health care provider to process the results.  Change the batteries as recommended by your health  care provider. SEEK IMMEDIATE MEDICAL CARE IF:   You have chest pain.  You have extreme difficulty breathing or shortness of breath.  You develop a very fast heartbeat that persists.  You develop dizziness that does not go away.  You faint or constantly feel you are about to faint.   This information is not intended to replace advice given to you by your health care provider. Make sure you discuss any questions you have with your health care provider.   Document Released: 02/16/2008 Document Revised: 05/30/2014 Document Reviewed: 11/05/2012 Elsevier Interactive Patient Education [employer].
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
## 2015-03-04 ENCOUNTER — Ambulatory Visit (INDEPENDENT_AMBULATORY_CARE_PROVIDER_SITE_OTHER): Payer: BLUE CROSS/BLUE SHIELD

## 2015-03-04 DIAGNOSIS — R079 Chest pain, unspecified: Secondary | ICD-10-CM | POA: Diagnosis not present

## 2015-03-04 DIAGNOSIS — R002 Palpitations: Secondary | ICD-10-CM | POA: Diagnosis not present

## 2015-04-02 ENCOUNTER — Inpatient Hospital Stay: Payer: BLUE CROSS/BLUE SHIELD | Attending: Oncology

## 2015-04-02 ENCOUNTER — Inpatient Hospital Stay (HOSPITAL_BASED_OUTPATIENT_CLINIC_OR_DEPARTMENT_OTHER): Payer: BLUE CROSS/BLUE SHIELD | Admitting: Oncology

## 2015-04-02 VITALS — BP 138/80 | HR 106 | Temp 96.7°F | Wt 209.0 lb

## 2015-04-02 DIAGNOSIS — Z9221 Personal history of antineoplastic chemotherapy: Secondary | ICD-10-CM

## 2015-04-02 DIAGNOSIS — N858 Other specified noninflammatory disorders of uterus: Secondary | ICD-10-CM | POA: Diagnosis not present

## 2015-04-02 DIAGNOSIS — Z85828 Personal history of other malignant neoplasm of skin: Secondary | ICD-10-CM | POA: Diagnosis not present

## 2015-04-02 DIAGNOSIS — C3412 Malignant neoplasm of upper lobe, left bronchus or lung: Secondary | ICD-10-CM

## 2015-04-02 DIAGNOSIS — M1990 Unspecified osteoarthritis, unspecified site: Secondary | ICD-10-CM

## 2015-04-02 DIAGNOSIS — J449 Chronic obstructive pulmonary disease, unspecified: Secondary | ICD-10-CM | POA: Diagnosis not present

## 2015-04-02 DIAGNOSIS — F322 Major depressive disorder, single episode, severe without psychotic features: Secondary | ICD-10-CM | POA: Diagnosis not present

## 2015-04-02 DIAGNOSIS — I10 Essential (primary) hypertension: Secondary | ICD-10-CM

## 2015-04-02 DIAGNOSIS — Z79899 Other long term (current) drug therapy: Secondary | ICD-10-CM | POA: Diagnosis not present

## 2015-04-02 DIAGNOSIS — F17210 Nicotine dependence, cigarettes, uncomplicated: Secondary | ICD-10-CM

## 2015-04-02 DIAGNOSIS — Z23 Encounter for immunization: Secondary | ICD-10-CM | POA: Diagnosis not present

## 2015-04-02 DIAGNOSIS — C3490 Malignant neoplasm of unspecified part of unspecified bronchus or lung: Secondary | ICD-10-CM

## 2015-04-02 LAB — CBC WITH DIFFERENTIAL/PLATELET
Basophils Absolute: 0.1 10*3/uL (ref 0–0.1)
Basophils Relative: 1 %
Eosinophils Absolute: 0.1 10*3/uL (ref 0–0.7)
Eosinophils Relative: 1 %
HCT: 38.6 % (ref 35.0–47.0)
Hemoglobin: 12.9 g/dL (ref 12.0–16.0)
LYMPHS PCT: 36 %
Lymphs Abs: 3.3 10*3/uL (ref 1.0–3.6)
MCH: 29.9 pg (ref 26.0–34.0)
MCHC: 33.4 g/dL (ref 32.0–36.0)
MCV: 89.7 fL (ref 80.0–100.0)
Monocytes Absolute: 0.6 10*3/uL (ref 0.2–0.9)
Monocytes Relative: 7 %
NEUTROS PCT: 55 %
Neutro Abs: 5 10*3/uL (ref 1.4–6.5)
Platelets: 288 10*3/uL (ref 150–440)
RBC: 4.3 MIL/uL (ref 3.80–5.20)
RDW: 12.7 % (ref 11.5–14.5)
WBC: 9.1 10*3/uL (ref 3.6–11.0)

## 2015-04-02 LAB — COMPREHENSIVE METABOLIC PANEL
ALT: 18 U/L (ref 14–54)
AST: 26 U/L (ref 15–41)
Albumin: 4.1 g/dL (ref 3.5–5.0)
Alkaline Phosphatase: 68 U/L (ref 38–126)
Anion gap: 9 (ref 5–15)
BILIRUBIN TOTAL: 0.5 mg/dL (ref 0.3–1.2)
BUN: 11 mg/dL (ref 6–20)
CALCIUM: 8.9 mg/dL (ref 8.9–10.3)
CO2: 26 mmol/L (ref 22–32)
CREATININE: 0.62 mg/dL (ref 0.44–1.00)
Chloride: 101 mmol/L (ref 101–111)
GFR calc Af Amer: >60 mL/min (ref >60)
Glucose, Bld: 121 mg/dL — ABNORMAL HIGH (ref 65–99)
Potassium: 3.6 mmol/L (ref 3.5–5.1)
Sodium: 136 mmol/L (ref 135–145)
TOTAL PROTEIN: 7.2 g/dL (ref 6.5–8.1)

## 2015-04-02 MED ORDER — INFLUENZA VAC SPLIT QUAD 0.5 ML IM SUSY
0.5000 mL | PREFILLED_SYRINGE | Freq: Once | INTRAMUSCULAR | Status: AC
  Administered 2015-04-02: 0.5 mL via INTRAMUSCULAR
  Filled 2015-04-02: qty 0.5

## 2015-04-04 ENCOUNTER — Encounter: Payer: Self-pay | Admitting: Oncology

## 2015-04-04 NOTE — Progress Notes (Signed)
[place] @ [facility] [phone]  Fax:([phone]) [place] OB: May 17, 1961  MR#: [id number]  BDZ#:[id number]  Patient Care Team: Guadalupe Maple, MD as PCP - General (Family Medicine) Forest Gleason, MD (Unknown Physician Specialty) Robert Bellow, MD (General Surgery)  CHIEF COMPLAINT:  Chief Complaint  Patient presents with  . OTHER    Oncology History    Subjective: Chief Complaint/Diagnosis:   1.patient underwent left upper lobecectomy (April, 2015)  2 different nodules in same lung consistent with adenocarcinoma.   T3, N0, M0 tumor stage II a Visceral pleural involvement and 2 nodules in the same lobe of the lung 3 patient was resistered   for Master protocol under [facility] 4.started onadjuvant chemotherapy with cis-platinum and Alimta(may 11th, 2015) EGFR is   wild  type. no alk mutation      Lung cancer ([place])   12/18/2013 Initial Diagnosis Lung cancer    No flowsheet data found.  INTERVAL HISTORY: 54 year old lady who had a previous history of carcinoma of lung had a recent PET scan for restaging.   Patient is here for ongoing evaluation and treatment consideration.  Does not have any cough or shortness of breath appetite is improved. No chest pain or bony pain REVIEW OF SYSTEMS:   GENERAL not in any acute distress HEENT:  No visual changes, runny nose, sore throat, mouth sores or tenderness. Lungs: No shortness of breath or cough.  No hemoptysis. Cardiac:  No chest pain, palpitations, orthopnea, or PND. GI:  No nausea, vomiting, diarrhea, constipation, melena or hematochezia. GU:  No urgency, frequency, dysuria, or hematuria. Musculoskeletal:  No back pain.  No joint pain.  No muscle tenderness. Extremities:  No pain or swelling. Skin:  No rashes or skin changes. Neuro:  No headache, numbness or weakness, balance or coordination issues. Endocrine:  No diabetes, thyroid issues, hot flashes or night sweats. Psych: Angry.   Depressed Pain:  No focal pain. Continues to have is intermittent pain in the left chest wall area at the site of previous surgery. Review of systems:  All other systems reviewed and found to be negative.  As per HPI. Otherwise, a complete review of systems is negatve.  PAST MEDICAL HISTORY: Past Medical History  Diagnosis Date  . Cancer ([place]) 08/30/13    Lung, tx removal of upper left lobe and chemotherapy  . Arthritis   . Insomnia   . Skin cancer   . Anxiety   . Hypertension     PAST SURGICAL HISTORY: Past Surgical History  Procedure Laterality Date  . Upper lobectomy Left   . Cesarean section    . Abdominal hysterectomy    . Cholecystectomy  01-02-14  . Colonoscopy  05-13-14    Dr Bary Castilla    FAMILY HISTORY Family History  Problem Relation Age of Onset  . Thyroid cancer Mother   . Thyroid cancer Brother          ADVANCED DIRECTIVES:  Does not have living will  HEALTH MAINTENANCE: Social History  Substance Use Topics  . Smoking status: Current Every Day Smoker -- 0.25 packs/day for 35 years    Types: Cigarettes    Last Attempt to Quit: 05/23/2013  . Smokeless tobacco: Never Used  . Alcohol Use: 0.0 oz/week    0 Standard drinks or equivalent per week     Comment: rare      Allergies  Allergen Reactions  . Seroquel [Quetiapine Fumarate] Other (See Comments)    hallunications  Current Outpatient Prescriptions  Medication Sig Dispense Refill  . benazepril (LOTENSIN) 40 MG tablet Take 1 tablet (40 mg total) by mouth daily. 30 tablet 12  . estradiol (ESTRACE) 1 MG tablet Take 1 tablet (1 mg total) by mouth daily. 30 tablet 12  . loperamide (IMODIUM) 2 MG capsule Take by mouth daily.    Marland Kitchen omeprazole (PRILOSEC) 40 MG capsule Take 1 capsule (40 mg total) by mouth daily. 30 capsule 12  . oxyCODONE (ROXICODONE) 15 MG immediate release tablet Take 15 mg by mouth every 6 (six) hours as needed.   0  . varenicline (CHANTIX CONTINUING MONTH PAK) 1 MG tablet Take  1 tablet (1 mg total) by mouth 2 (two) times daily. 60 tablet 5   No current facility-administered medications for this visit.    OBJECTIVE:  Filed Vitals:   04/02/15 1149  BP: 138/80  Pulse: 106  Temp: 96.7 F (35.9 C)     Body mass index is 38.22 kg/(m^2).    ECOG FS:1 - Symptomatic but completely ambulatory  PHYSICAL EXAM: GENERAL:  Well developed, well nourished, sitting comfortably in the exam room in no acute distress. MENTAL STATUS:  Alert and oriented to person, place and time. HEAD: Normocephalic, atraumatic, face symmetric, no Cushingoid features. EYES: .  Pupils equal round and reactive to light and accomodation.  No conjunctivitis or scleral icterus. ENT:  Oropharynx clear without lesion.  Tongue normal. Mucous membranes moist.  RESPIRATORY:  Clear to auscultation without rales, wheezes or rhonchi. CARDIOVASCULAR:  Regular rate and rhythm without murmur, rub or gallop. BREAST:  Right breast without masses, skin changes or nipple discharge.  Left breast without masses, skin changes or nipple discharge. ABDOMEN:  Soft, non-tender, with active bowel sounds, and no hepatosplenomegaly.  No masses. BACK:  No CVA tenderness.  No tenderness on percussion of the back or rib cage. SKIN:  No rashes, ulcers or lesions. EXTREMITIES: No edema, no skin discoloration or tenderness.  No palpable cords. LYMPH NODES: No palpable cervical, supraclavicular, axillary or inguinal adenopathy  NEUROLOGICAL: Unremarkable. PSYCH:  Angry and depressed. LAB RESULTS:  Appointment on 04/02/2015  Component Date Value Ref Range Status  . WBC 04/02/2015 9.1  3.6 - 11.0 K/uL Final  . RBC 04/02/2015 4.30  3.80 - 5.20 MIL/uL Final  . Hemoglobin 04/02/2015 12.9  12.0 - 16.0 g/dL Final  . HCT 04/02/2015 38.6  35.0 - 47.0 % Final  . MCV 04/02/2015 89.7  80.0 - 100.0 fL Final  . MCH 04/02/2015 29.9  26.0 - 34.0 pg Final  . MCHC 04/02/2015 33.4  32.0 - 36.0 g/dL Final  . RDW 04/02/2015 12.7  11.5 - 14.5  % Final  . Platelets 04/02/2015 288  150 - 440 K/uL Final  . Neutrophils Relative % 04/02/2015 55   Final  . Neutro Abs 04/02/2015 5.0  1.4 - 6.5 K/uL Final  . Lymphocytes Relative 04/02/2015 36   Final  . Lymphs Abs 04/02/2015 3.3  1.0 - 3.6 K/uL Final  . Monocytes Relative 04/02/2015 7   Final  . Monocytes Absolute 04/02/2015 0.6  0.2 - 0.9 K/uL Final  . Eosinophils Relative 04/02/2015 1   Final  . Eosinophils Absolute 04/02/2015 0.1  0 - 0.7 K/uL Final  . Basophils Relative 04/02/2015 1   Final  . Basophils Absolute 04/02/2015 0.1  0 - 0.1 K/uL Final  . Sodium 04/02/2015 136  135 - 145 mmol/L Final  . Potassium 04/02/2015 3.6  3.5 - 5.1 mmol/L Final  .  Chloride 04/02/2015 101  101 - 111 mmol/L Final  . CO2 04/02/2015 26  22 - 32 mmol/L Final  . Glucose, Bld 04/02/2015 121* 65 - 99 mg/dL Final  . BUN 04/02/2015 11  6 - 20 mg/dL Final  . Creatinine, Ser 04/02/2015 0.62  0.44 - 1.00 mg/dL Final  . Calcium 04/02/2015 8.9  8.9 - 10.3 mg/dL Final  . Total Protein 04/02/2015 7.2  6.5 - 8.1 g/dL Final  . Albumin 04/02/2015 4.1  3.5 - 5.0 g/dL Final  . AST 04/02/2015 26  15 - 41 U/L Final  . ALT 04/02/2015 18  14 - 54 U/L Final  . Alkaline Phosphatase 04/02/2015 68  38 - 126 U/L Final  . Total Bilirubin 04/02/2015 0.5  0.3 - 1.2 mg/dL Final  . GFR calc non Af Amer 04/02/2015 >60  >60 mL/min Final  . GFR calc Af Amer 04/02/2015 >60  >60 mL/min Final   Comment: (NOTE) The eGFR has been calculated using the CKD EPI equation. This calculation has not been validated in all clinical situations. eGFR's persistently <60 mL/min signify possible Chronic Kidney Disease.   . Anion gap 04/02/2015 9  5 - 15 Final      STUDIES: No results found.  ASSESSMENT: Carcinoma of lung left upper lobe.  Adenocarcinoma.  EGFR and IALK will not mutated. 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 She also understands that She can call clinic at any time with any questions, concerns, or complaints.    Lung cancer   Staging form: Lung, AJCC 7th Edition     Clinical: Stage IIB (yT3, N0, M0) - Marni Griffon, MD   04/04/2015 6:16 AM

## 2015-04-10 ENCOUNTER — Encounter: Payer: Self-pay | Admitting: Family Medicine

## 2015-04-23 ENCOUNTER — Other Ambulatory Visit: Payer: Self-pay | Admitting: *Deleted

## 2015-04-23 ENCOUNTER — Other Ambulatory Visit: Payer: Self-pay | Admitting: Family Medicine

## 2015-04-23 DIAGNOSIS — C3412 Malignant neoplasm of upper lobe, left bronchus or lung: Secondary | ICD-10-CM

## 2015-04-24 ENCOUNTER — Ambulatory Visit: Admission: RE | Admit: 2015-04-24 | Payer: BLUE CROSS/BLUE SHIELD | Source: Ambulatory Visit

## 2015-04-30 ENCOUNTER — Ambulatory Visit
Admission: RE | Admit: 2015-04-30 | Discharge: 2015-04-30 | Disposition: A | Discharge status: Home / Self Care | Payer: BLUE CROSS/BLUE SHIELD | Source: Ambulatory Visit | Attending: Oncology | Admitting: Oncology

## 2015-04-30 DIAGNOSIS — R918 Other nonspecific abnormal finding of lung field: Secondary | ICD-10-CM | POA: Diagnosis not present

## 2015-04-30 DIAGNOSIS — C3412 Malignant neoplasm of upper lobe, left bronchus or lung: Secondary | ICD-10-CM | POA: Insufficient documentation

## 2015-04-30 MED ORDER — IOHEXOL 300 MG/ML  SOLN
75.0000 mL | Freq: Once | INTRAMUSCULAR | Status: AC | PRN
  Administered 2015-04-30: 75 mL via INTRAVENOUS

## 2015-05-16 ENCOUNTER — Other Ambulatory Visit: Payer: Self-pay | Admitting: Family Medicine

## 2015-06-16 ENCOUNTER — Ambulatory Visit: Payer: BLUE CROSS/BLUE SHIELD | Admitting: Family Medicine

## 2015-07-09 ENCOUNTER — Encounter: Payer: Self-pay | Admitting: Family Medicine

## 2015-07-09 ENCOUNTER — Ambulatory Visit (INDEPENDENT_AMBULATORY_CARE_PROVIDER_SITE_OTHER): Payer: BLUE CROSS/BLUE SHIELD | Admitting: Family Medicine

## 2015-07-09 VITALS — BP 125/77 | HR 80 | Temp 97.8°F | Ht 62.0 in | Wt 213.0 lb

## 2015-07-09 DIAGNOSIS — R0981 Nasal congestion: Secondary | ICD-10-CM | POA: Diagnosis not present

## 2015-07-09 DIAGNOSIS — C3412 Malignant neoplasm of upper lobe, left bronchus or lung: Secondary | ICD-10-CM | POA: Diagnosis not present

## 2015-07-09 MED ORDER — ALBUTEROL SULFATE HFA 108 (90 BASE) MCG/ACT IN AERS: INHALATION_SPRAY | RESPIRATORY_TRACT | Status: AC

## 2015-07-09 MED ORDER — AMOXICILLIN-POT CLAVULANATE 875-125 MG PO TABS: 1.0000 | ORAL_TABLET | Freq: Two times a day (BID) | ORAL | Status: DC

## 2015-07-09 MED ORDER — FLUCONAZOLE 150 MG PO TABS: 150.0000 mg | ORAL_TABLET | Freq: Once | ORAL | Status: DC

## 2015-07-09 NOTE — Progress Notes (Signed)
BP 125/77 mmHg  Pulse 80  Temp(Src) 97.8 F (36.6 C)  Ht '5\' 2"'$  (1.575 m)  Wt 213 lb (96.616 kg)  BMI 38.95 kg/m2  SpO2 99%   Subjective:    Patient ID: Kathlene November, female    DOB: Oct 19, 1960, 55 y.o.   MRN: [id number]  HPI: NORMAN BIER is a 55 y.o. female  Chief Complaint  Patient presents with  . Follow-up  . trouble with breathing    feels there is something in left nostril   patient follow-up some left nasal sense of fullness is been ongoing for about 3 months with some congestion type symptoms but no real nose drainage. No known foreign body etc. Has tried some Claritin and it feels too dry.  Patient also with marked sinus drainage congestion cough and some intermittent fever ongoing over the last 2 weeks with productive cough of greenish sputum and occasional blood tinged. Reviewed Dr. Marcelle Overlie last notes and patient was stable.   Relevant past medical, surgical, family and social history reviewed and updated as indicated. Interim medical history since our last visit reviewed. Allergies and medications reviewed and updated.  Review of Systems  Constitutional: Negative.   HENT: Positive for congestion and sinus pressure. Negative for ear discharge, ear pain, facial swelling, hearing loss, nosebleeds, postnasal drip, rhinorrhea and sneezing.   Respiratory: Positive for cough and shortness of breath. Negative for wheezing and stridor.   Cardiovascular: Negative.  Negative for chest pain.    Per HPI unless specifically indicated above     Objective:    BP 125/77 mmHg  Pulse 80  Temp(Src) 97.8 F (36.6 C)  Ht '5\' 2"'$  (1.575 m)  Wt 213 lb (96.616 kg)  BMI 38.95 kg/m2  SpO2 99%  Wt Readings from Last 3 Encounters:  07/09/15 213 lb (96.616 kg)  04/02/15 208 lb 15.9 oz (94.8 kg)  03/02/15 203 lb 4 oz (92.194 kg)    Physical Exam  Constitutional: She is oriented to person, place, and time. She appears well-developed and well-nourished. No distress.   HENT:  Head: Normocephalic and atraumatic.  Right Ear: Hearing and external ear normal.  Left Ear: Hearing and external ear normal.  Nose: Nose normal.  Mouth/Throat: Oropharynx is clear and moist.  Eyes: Conjunctivae and lids are normal. Right eye exhibits no discharge. Left eye exhibits no discharge. No scleral icterus.  Cardiovascular: Normal rate, regular rhythm and normal heart sounds.   Pulmonary/Chest: Effort normal and breath sounds normal. No respiratory distress. She has no wheezes. She has no rales. She exhibits no tenderness.  Musculoskeletal: Normal range of motion.  Neurological: She is alert and oriented to person, place, and time.  Skin: Skin is intact. No rash noted.  Psychiatric: She has a normal mood and affect. Her speech is normal and behavior is normal. Judgment and thought content normal. Cognition and memory are normal.    Results for orders placed or performed in visit on 04/02/15  CBC with Differential  Result Value Ref Range   WBC 9.1 3.6 - 11.0 K/uL   RBC 4.30 3.80 - 5.20 MIL/uL   Hemoglobin 12.9 12.0 - 16.0 g/dL   HCT 38.6 35.0 - 47.0 %   MCV 89.7 80.0 - 100.0 fL   MCH 29.9 26.0 - 34.0 pg   MCHC 33.4 32.0 - 36.0 g/dL   RDW 12.7 11.5 - 14.5 %   Platelets 288 150 - 440 K/uL   Neutrophils Relative % 55 %   Neutro Abs  5.0 1.4 - 6.5 K/uL   Lymphocytes Relative 36 %   Lymphs Abs 3.3 1.0 - 3.6 K/uL   Monocytes Relative 7 %   Monocytes Absolute 0.6 0.2 - 0.9 K/uL   Eosinophils Relative 1 %   Eosinophils Absolute 0.1 0 - 0.7 K/uL   Basophils Relative 1 %   Basophils Absolute 0.1 0 - 0.1 K/uL  Comprehensive metabolic panel  Result Value Ref Range   Sodium 136 135 - 145 mmol/L   Potassium 3.6 3.5 - 5.1 mmol/L   Chloride 101 101 - 111 mmol/L   CO2 26 22 - 32 mmol/L   Glucose, Bld 121 (H) 65 - 99 mg/dL   BUN 11 6 - 20 mg/dL   Creatinine, Ser 0.62 0.44 - 1.00 mg/dL   Calcium 8.9 8.9 - 10.3 mg/dL   Total Protein 7.2 6.5 - 8.1 g/dL   Albumin 4.1 3.5 -  5.0 g/dL   AST 26 15 - 41 U/L   ALT 18 14 - 54 U/L   Alkaline Phosphatase 68 38 - 126 U/L   Total Bilirubin 0.5 0.3 - 1.2 mg/dL   GFR calc non Af Amer >60 >60 mL/min   GFR calc Af Amer >60 >60 mL/min   Anion gap 9 5 - 15      Assessment & Plan:   Problem List Items Addressed This Visit      Respiratory   Lung cancer ([place]) - Primary    Patient with acute bronchitis with blood-tinged sputum and setting of history of cancer patient will follow-up at the cancer center to further evaluate symptoms see if any further workup is needed. Will start Augmentin for infection control along with Diflucan        Other   Chronic nasal congestion    With 3 months of left nasal obstruction symptoms will refer to ear nose and throat to further evaluate [place] ear nose and throat.      Relevant Orders   Ambulatory referral to ENT      Patient also wants to go back to the bariatric center to start working on weight loss wants active diet plan and their support which is been helpful in the past. Follow up plan: Return in about 6 months (around 01/06/2016) for Physical Exam this summer.

## 2015-07-09 NOTE — Assessment & Plan Note (Signed)
With 3 months of left nasal obstruction symptoms will refer to ear nose and throat to further evaluate [place] ear nose and throat.

## 2015-07-09 NOTE — Assessment & Plan Note (Signed)
Patient with acute bronchitis with blood-tinged sputum and setting of history of cancer patient will follow-up at the cancer center to further evaluate symptoms see if any further workup is needed. Will start Augmentin for infection control along with Diflucan

## 2015-07-16 ENCOUNTER — Telehealth: Payer: Self-pay | Admitting: Family Medicine

## 2015-07-16 MED ORDER — AMOXICILLIN-POT CLAVULANATE 875-125 MG PO TABS: 1.0000 | ORAL_TABLET | Freq: Two times a day (BID) | ORAL | Status: DC

## 2015-07-16 NOTE — Telephone Encounter (Signed)
Patient wants to talk to either Dr. Jeananne Rama or CMA regarding issues with her health. Please return patients call back [phone].

## 2015-07-16 NOTE — Telephone Encounter (Signed)
Phone call largely better but still some residual symptoms patient's immune system compromised with chemotherapy will give another prescription to hold and start taking if getting worse.

## 2015-08-25 ENCOUNTER — Telehealth: Payer: Self-pay

## 2015-08-25 NOTE — Telephone Encounter (Signed)
Would like a call from [facility], no details given

## 2015-08-31 ENCOUNTER — Ambulatory Visit: Payer: BLUE CROSS/BLUE SHIELD | Admitting: Cardiovascular Disease

## 2015-09-10 ENCOUNTER — Institutional Professional Consult (permissible substitution): Payer: BLUE CROSS/BLUE SHIELD | Admitting: Cardiology

## 2015-09-18 ENCOUNTER — Ambulatory Visit
Admission: RE | Admit: 2015-09-18 | Payer: BLUE CROSS/BLUE SHIELD | Source: Ambulatory Visit | Admitting: Unknown Physician Specialty

## 2015-09-18 ENCOUNTER — Encounter: Admission: RE | Payer: Self-pay | Source: Ambulatory Visit

## 2015-09-18 SURGERY — SEPTOPLASTY, NOSE
Anesthesia: General | Laterality: Bilateral

## 2015-09-22 ENCOUNTER — Telehealth: Payer: Self-pay | Admitting: Family Medicine

## 2015-09-22 NOTE — Telephone Encounter (Signed)
Pt called wanted to see Dr. Jeananne Rama ASAP, pt informed next available appt is next Tuesday. Pt then asked if she could come sit in the lobby and be worked in. Pt informed Dr. Jeananne Rama already has a full schedule and we would not be able to accommodate her. 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 Wynetta Emery or Malachy Mood, pt declined. Instructed staff to have Izora Gala or Dr. Jeananne Rama call her back because they would work her in. Please call pt to follow up. Thanks.

## 2015-09-22 NOTE — Telephone Encounter (Signed)
Patient will come in tomorrow at 11am

## 2015-09-23 ENCOUNTER — Ambulatory Visit (INDEPENDENT_AMBULATORY_CARE_PROVIDER_SITE_OTHER): Payer: BLUE CROSS/BLUE SHIELD | Admitting: Family Medicine

## 2015-09-23 ENCOUNTER — Encounter: Payer: Self-pay | Admitting: Family Medicine

## 2015-09-23 VITALS — BP 119/77 | HR 118 | Temp 98.6°F | Ht 62.0 in | Wt 219.0 lb

## 2015-09-23 DIAGNOSIS — A770 Spotted fever due to Rickettsia rickettsii: Secondary | ICD-10-CM

## 2015-09-23 MED ORDER — DOXYCYCLINE HYCLATE 100 MG PO CAPS: 100.0000 mg | ORAL_CAPSULE | Freq: Two times a day (BID) | ORAL | Status: DC

## 2015-09-23 MED ORDER — SUMATRIPTAN SUCCINATE 100 MG PO TABS: 100.0000 mg | ORAL_TABLET | ORAL | Status: DC | PRN

## 2015-09-23 NOTE — Progress Notes (Signed)
BP 119/77 mmHg  Pulse 118  Temp(Src) 98.6 F (37 C)  Ht '5\' 2"'$  (1.575 m)  Wt 219 lb (99.338 kg)  BMI 40.05 kg/m2  SpO2 98%   Subjective:    Patient ID: Natalie Stark, female    DOB: 1960-12-07, 55 y.o.   MRN: [id number]  HPI: Natalie Stark is a 55 y.o. female  Chief Complaint  Patient presents with  . fatigue, sleeping, headache, joint pain    removed tick last week  Patient with marked aching muscle soreness low-grade fever no cough feeling bad all over. Has developed her usual migraine headache symptoms again hasn't had that for years. No rash around tick bite No nausea vomiting Patient night taking chemotherapy at this time lung cancer is stable Relevant past medical, surgical, family and social history reviewed and updated as indicated. Interim medical history since our last visit reviewed. Allergies and medications reviewed and updated.  Review of Systems  Constitutional: Positive for fever, chills and fatigue. Negative for diaphoresis.  Respiratory: Negative.   Cardiovascular: Negative.     Per HPI unless specifically indicated above     Objective:    BP 119/77 mmHg  Pulse 118  Temp(Src) 98.6 F (37 C)  Ht '5\' 2"'$  (1.575 m)  Wt 219 lb (99.338 kg)  BMI 40.05 kg/m2  SpO2 98%  Wt Readings from Last 3 Encounters:  09/23/15 219 lb (99.338 kg)  07/09/15 213 lb (96.616 kg)  04/02/15 208 lb 15.9 oz (94.8 kg)    Physical Exam  Constitutional: She is oriented to person, place, and time. She appears well-developed and well-nourished. No distress.  HENT:  Head: Normocephalic and atraumatic.  Right Ear: Hearing normal.  Left Ear: Hearing normal.  Nose: Nose normal.  Eyes: Conjunctivae and lids are normal. Right eye exhibits no discharge. Left eye exhibits no discharge. No scleral icterus.  Cardiovascular: Normal rate, regular rhythm and normal heart sounds.   Pulmonary/Chest: Effort normal and breath sounds normal. No respiratory distress.   Musculoskeletal: Normal range of motion.  Neurological: She is alert and oriented to person, place, and time.  Skin: Skin is intact. No rash noted.  Tick bite area small red dot no rash  Psychiatric: She has a normal mood and affect. Her speech is normal and behavior is normal. Judgment and thought content normal. Cognition and memory are normal.    Results for orders placed or performed in visit on 04/02/15  CBC with Differential  Result Value Ref Range   WBC 9.1 3.6 - 11.0 K/uL   RBC 4.30 3.80 - 5.20 MIL/uL   Hemoglobin 12.9 12.0 - 16.0 g/dL   HCT 38.6 35.0 - 47.0 %   MCV 89.7 80.0 - 100.0 fL   MCH 29.9 26.0 - 34.0 pg   MCHC 33.4 32.0 - 36.0 g/dL   RDW 12.7 11.5 - 14.5 %   Platelets 288 150 - 440 K/uL   Neutrophils Relative % 55 %   Neutro Abs 5.0 1.4 - 6.5 K/uL   Lymphocytes Relative 36 %   Lymphs Abs 3.3 1.0 - 3.6 K/uL   Monocytes Relative 7 %   Monocytes Absolute 0.6 0.2 - 0.9 K/uL   Eosinophils Relative 1 %   Eosinophils Absolute 0.1 0 - 0.7 K/uL   Basophils Relative 1 %   Basophils Absolute 0.1 0 - 0.1 K/uL  Comprehensive metabolic panel  Result Value Ref Range   Sodium 136 135 - 145 mmol/L   Potassium 3.6 3.5 - 5.1  mmol/L   Chloride 101 101 - 111 mmol/L   CO2 26 22 - 32 mmol/L   Glucose, Bld 121 (H) 65 - 99 mg/dL   BUN 11 6 - 20 mg/dL   Creatinine, Ser 0.62 0.44 - 1.00 mg/dL   Calcium 8.9 8.9 - 10.3 mg/dL   Total Protein 7.2 6.5 - 8.1 g/dL   Albumin 4.1 3.5 - 5.0 g/dL   AST 26 15 - 41 U/L   ALT 18 14 - 54 U/L   Alkaline Phosphatase 68 38 - 126 U/L   Total Bilirubin 0.5 0.3 - 1.2 mg/dL   GFR calc non Af Amer >60 >60 mL/min   GFR calc Af Amer >60 >60 mL/min   Anion gap 9 5 - 15      Assessment & Plan:   Problem List Items Addressed This Visit    None    Visit Diagnoses    [facility] spotted fever)    -  Primary    Discussed care and treatment use of Tylenol fluids caution with doxycycline and sunburn caution if not getting better use of  Imitrex for headaches        Follow up plan: Return if symptoms worsen or fail to improve, for As scheduled.

## 2015-09-30 ENCOUNTER — Other Ambulatory Visit: Payer: BLUE CROSS/BLUE SHIELD

## 2015-09-30 ENCOUNTER — Ambulatory Visit: Payer: BLUE CROSS/BLUE SHIELD | Admitting: Oncology

## 2015-10-01 ENCOUNTER — Inpatient Hospital Stay: Payer: BLUE CROSS/BLUE SHIELD | Attending: Family Medicine

## 2015-10-01 ENCOUNTER — Inpatient Hospital Stay (HOSPITAL_BASED_OUTPATIENT_CLINIC_OR_DEPARTMENT_OTHER): Payer: BLUE CROSS/BLUE SHIELD | Admitting: Family Medicine

## 2015-10-01 VITALS — BP 123/83 | HR 109 | Temp 96.1°F | Resp 20 | Wt 214.1 lb

## 2015-10-01 DIAGNOSIS — J342 Deviated nasal septum: Secondary | ICD-10-CM | POA: Diagnosis not present

## 2015-10-01 DIAGNOSIS — Z85828 Personal history of other malignant neoplasm of skin: Secondary | ICD-10-CM | POA: Diagnosis not present

## 2015-10-01 DIAGNOSIS — I10 Essential (primary) hypertension: Secondary | ICD-10-CM | POA: Diagnosis not present

## 2015-10-01 DIAGNOSIS — Z79899 Other long term (current) drug therapy: Secondary | ICD-10-CM | POA: Diagnosis not present

## 2015-10-01 DIAGNOSIS — F419 Anxiety disorder, unspecified: Secondary | ICD-10-CM

## 2015-10-01 DIAGNOSIS — Z9221 Personal history of antineoplastic chemotherapy: Secondary | ICD-10-CM | POA: Insufficient documentation

## 2015-10-01 DIAGNOSIS — Z85118 Personal history of other malignant neoplasm of bronchus and lung: Secondary | ICD-10-CM

## 2015-10-01 DIAGNOSIS — Z7982 Long term (current) use of aspirin: Secondary | ICD-10-CM | POA: Insufficient documentation

## 2015-10-01 DIAGNOSIS — R5381 Other malaise: Secondary | ICD-10-CM | POA: Insufficient documentation

## 2015-10-01 DIAGNOSIS — R5383 Other fatigue: Secondary | ICD-10-CM | POA: Insufficient documentation

## 2015-10-01 DIAGNOSIS — M1990 Unspecified osteoarthritis, unspecified site: Secondary | ICD-10-CM | POA: Diagnosis not present

## 2015-10-01 DIAGNOSIS — C3412 Malignant neoplasm of upper lobe, left bronchus or lung: Secondary | ICD-10-CM

## 2015-10-01 DIAGNOSIS — C3492 Malignant neoplasm of unspecified part of left bronchus or lung: Secondary | ICD-10-CM

## 2015-10-01 DIAGNOSIS — Z87891 Personal history of nicotine dependence: Secondary | ICD-10-CM | POA: Diagnosis not present

## 2015-10-01 LAB — CBC WITH DIFFERENTIAL/PLATELET
Basophils Absolute: 0.1 10*3/uL (ref 0–0.1)
Basophils Relative: 1 %
EOS ABS: 0.6 10*3/uL (ref 0–0.7)
EOS PCT: 5 %
HCT: 41 % (ref 35.0–47.0)
Hemoglobin: 13.6 g/dL (ref 12.0–16.0)
LYMPHS ABS: 3.4 10*3/uL (ref 1.0–3.6)
Lymphocytes Relative: 29 %
MCH: 28.8 pg (ref 26.0–34.0)
MCHC: 33.2 g/dL (ref 32.0–36.0)
MCV: 86.8 fL (ref 80.0–100.0)
MONOS PCT: 5 %
Monocytes Absolute: 0.6 10*3/uL (ref 0.2–0.9)
Neutro Abs: 7.1 10*3/uL — ABNORMAL HIGH (ref 1.4–6.5)
Neutrophils Relative %: 60 %
PLATELETS: 290 10*3/uL (ref 150–440)
RBC: 4.73 MIL/uL (ref 3.80–5.20)
RDW: 13.4 % (ref 11.5–14.5)
WBC: 11.8 10*3/uL — ABNORMAL HIGH (ref 3.6–11.0)

## 2015-10-01 LAB — COMPREHENSIVE METABOLIC PANEL
ALT: 24 U/L (ref 14–54)
ANION GAP: 10 (ref 5–15)
AST: 28 U/L (ref 15–41)
Albumin: 4.1 g/dL (ref 3.5–5.0)
Alkaline Phosphatase: 85 U/L (ref 38–126)
BUN: 10 mg/dL (ref 6–20)
CALCIUM: 9 mg/dL (ref 8.9–10.3)
CHLORIDE: 102 mmol/L (ref 101–111)
CO2: 28 mmol/L (ref 22–32)
Creatinine, Ser: 0.7 mg/dL (ref 0.44–1.00)
GFR calc non Af Amer: >60 mL/min (ref >60)
Glucose, Bld: 125 mg/dL — ABNORMAL HIGH (ref 65–99)
POTASSIUM: 4 mmol/L (ref 3.5–5.1)
SODIUM: 140 mmol/L (ref 135–145)
Total Bilirubin: 0.5 mg/dL (ref 0.3–1.2)
Total Protein: 7.7 g/dL (ref 6.5–8.1)

## 2015-10-01 NOTE — Progress Notes (Signed)
[facility]  [phone]  Fax:([phone]) [place] DOB: 1960/10/21  MR#: [id number]  SFK#:[id number]  Patient Care Team: Guadalupe Maple, MD as PCP - General (Family Medicine) Forest Gleason, MD (Unknown Physician Specialty) Robert Bellow, MD (General Surgery)  CHIEF COMPLAINT:  Chief Complaint  Patient presents with  . Lung Cancer    follow up    INTERVAL HISTORY:  Patient is here for further follow-up regarding carcinoma of the left upper lobe. Patient underwent left upper lobectomy in April 2015. She reports having some issues with stuffy nose and issues with breathing through her nose. She was sent to ENT who diagnosed her with a deviated septum. She is planning to have this corrected later in the year. She also reports some fatigue but also relates that to caring for her 6 grandchildren. She otherwise reports feeling very well and denies any cough, shortness of breath, wheezing, or congestion.  REVIEW OF SYSTEMS:   Review of Systems  Constitutional: Positive for malaise/fatigue. Negative for fever, chills, weight loss and diaphoresis.  HENT: Negative.   Eyes: Negative.   Respiratory: Negative for cough, hemoptysis, sputum production, shortness of breath and wheezing.   Cardiovascular: Negative for chest pain, palpitations, orthopnea, claudication, leg swelling and PND.  Gastrointestinal: Negative for heartburn, nausea, vomiting, abdominal pain, diarrhea, constipation, blood in stool and melena.  Genitourinary: Negative.   Musculoskeletal: Negative.   Skin: Negative.   Neurological: Negative for dizziness, tingling, focal weakness, seizures and weakness.  Endo/Heme/Allergies: Does not bruise/bleed easily.  Psychiatric/Behavioral: Negative for depression. The patient is not nervous/anxious and does not have insomnia.     As per HPI. Otherwise, a complete review of systems is negatve.  ONCOLOGY HISTORY: Oncology History      Subjective: Chief Complaint/Diagnosis:   1.patient underwent left upper lobecectomy (April, 2015)  2 different nodules in same lung consistent with adenocarcinoma.   T3, N0, M0 tumor stage II a Visceral pleural involvement and 2 nodules in the same lobe of the lung 3 patient was resistered   for Master protocol under [facility] 4.started onadjuvant chemotherapy with cis-platinum and Alimta(may 11th, 2015) EGFR is   wild  type. no alk mutation      Lung cancer ([place])   12/18/2013 Initial Diagnosis Lung cancer    PAST MEDICAL HISTORY: Past Medical History  Diagnosis Date  . Cancer ([place]) 08/30/13    Lung, tx removal of upper left lobe and chemotherapy  . Arthritis   . Insomnia   . Skin cancer   . Anxiety   . Hypertension     PAST SURGICAL HISTORY: Past Surgical History  Procedure Laterality Date  . Upper lobectomy Left   . Cesarean section    . Abdominal hysterectomy    . Cholecystectomy  01-02-14  . Colonoscopy  05-13-14    Dr Bary Castilla    FAMILY HISTORY Family History  Problem Relation Age of Onset  . Thyroid cancer Mother   . Thyroid cancer Brother     GYNECOLOGIC HISTORY:  No LMP recorded. Patient has had a hysterectomy.     ADVANCED DIRECTIVES:    HEALTH MAINTENANCE: Social History  Substance Use Topics  . Smoking status: Former Smoker -- 0.25 packs/day for 35 years    Types: Cigarettes    Quit date: 05/23/2014  . Smokeless tobacco: Never Used  . Alcohol Use: 0.0 oz/week    0 Standard drinks or equivalent per week     Comment: rare  Colonoscopy: January 2015  PAP:  Bone density:  Mammogram: February 2016  Allergies  Allergen Reactions  . Seroquel [Quetiapine Fumarate] Other (See Comments)    hallunications    Current Outpatient Prescriptions  Medication Sig Dispense Refill  . albuterol (VENTOLIN HFA) 108 (90 Base) MCG/ACT inhaler TAKE TWO PUFFS EVERY 4 TO 6 HOURS AS NEEDED FOR SHORTNESS OF BREATH. 18 g 2  . aspirin (ASPIRIN EC) 81 MG EC  tablet Take 81 mg by mouth daily. Swallow whole.    . benazepril (LOTENSIN) 40 MG tablet Take 1 tablet (40 mg total) by mouth daily. 30 tablet 12  . doxycycline (VIBRAMYCIN) 100 MG capsule Take 1 capsule (100 mg total) by mouth 2 (two) times daily. 20 capsule 0  . estradiol (ESTRACE) 1 MG tablet Take 1 tablet (1 mg total) by mouth daily. 30 tablet 12  . fluticasone (FLONASE) 50 MCG/ACT nasal spray   11  . loperamide (IMODIUM) 2 MG capsule Take by mouth daily.    Marland Kitchen omeprazole (PRILOSEC) 40 MG capsule Take 1 capsule (40 mg total) by mouth daily. 30 capsule 12  . oxyCODONE (ROXICODONE) 15 MG immediate release tablet Take 15 mg by mouth every 6 (six) hours as needed.   0  . SUMAtriptan (IMITREX) 100 MG tablet Take 1 tablet (100 mg total) by mouth every 2 (two) hours as needed for migraine. May repeat in 2 hours if headache persists or recurs. Max dose 2 pills in one day. 10 tablet 12   No current facility-administered medications for this visit.    OBJECTIVE: BP 123/83 mmHg  Pulse 109  Temp(Src) 96.1 F (35.6 C) (Tympanic)  Resp 20  Wt 214 lb 1.1 oz (97.1 kg)  SpO2 98%   Body mass index is 39.14 kg/(m^2).    ECOG FS:0 - Asymptomatic  General: Well-developed, well-nourished, no acute distress. Eyes: Pink conjunctiva, anicteric sclera. HEENT: Normocephalic, moist mucous membranes, clear oropharnyx. Lungs: Clear to auscultation bilaterally. Heart: Regular rate and rhythm. No rubs, murmurs, or gallops. Abdomen: Soft, nontender, nondistended. No organomegaly noted, normoactive bowel sounds. Musculoskeletal: No edema, cyanosis, or clubbing. Neuro: Alert, answering all questions appropriately. Cranial nerves grossly intact. Skin: No rashes or petechiae noted. Psych: Normal affect. Lymphatics: No cervical, clavicular LAD.   LAB RESULTS:  Appointment on 10/01/2015  Component Date Value Ref Range Status  . WBC 10/01/2015 11.8* 3.6 - 11.0 K/uL Final  . RBC 10/01/2015 4.73  3.80 - 5.20 MIL/uL  Final  . Hemoglobin 10/01/2015 13.6  12.0 - 16.0 g/dL Final  . HCT 10/01/2015 41.0  35.0 - 47.0 % Final  . MCV 10/01/2015 86.8  80.0 - 100.0 fL Final  . MCH 10/01/2015 28.8  26.0 - 34.0 pg Final  . MCHC 10/01/2015 33.2  32.0 - 36.0 g/dL Final  . RDW 10/01/2015 13.4  11.5 - 14.5 % Final  . Platelets 10/01/2015 290  150 - 440 K/uL Final  . Neutrophils Relative % 10/01/2015 60   Final  . Neutro Abs 10/01/2015 7.1* 1.4 - 6.5 K/uL Final  . Lymphocytes Relative 10/01/2015 29   Final  . Lymphs Abs 10/01/2015 3.4  1.0 - 3.6 K/uL Final  . Monocytes Relative 10/01/2015 5   Final  . Monocytes Absolute 10/01/2015 0.6  0.2 - 0.9 K/uL Final  . Eosinophils Relative 10/01/2015 5   Final  . Eosinophils Absolute 10/01/2015 0.6  0 - 0.7 K/uL Final  . Basophils Relative 10/01/2015 1   Final  . Basophils Absolute 10/01/2015 0.1  0 -  0.1 K/uL Final  . Sodium 10/01/2015 140  135 - 145 mmol/L Final  . Potassium 10/01/2015 4.0  3.5 - 5.1 mmol/L Final  . Chloride 10/01/2015 102  101 - 111 mmol/L Final  . CO2 10/01/2015 28  22 - 32 mmol/L Final  . Glucose, Bld 10/01/2015 125* 65 - 99 mg/dL Final  . BUN 10/01/2015 10  6 - 20 mg/dL Final  . Creatinine, Ser 10/01/2015 0.70  0.44 - 1.00 mg/dL Final  . Calcium 10/01/2015 9.0  8.9 - 10.3 mg/dL Final  . Total Protein 10/01/2015 7.7  6.5 - 8.1 g/dL Final  . Albumin 10/01/2015 4.1  3.5 - 5.0 g/dL Final  . AST 10/01/2015 28  15 - 41 U/L Final  . ALT 10/01/2015 24  14 - 54 U/L Final  . Alkaline Phosphatase 10/01/2015 85  38 - 126 U/L Final  . Total Bilirubin 10/01/2015 0.5  0.3 - 1.2 mg/dL Final  . GFR calc non Af Amer 10/01/2015 >60  >60 mL/min Final  . GFR calc Af Amer 10/01/2015 >60  >60 mL/min Final   Comment: (NOTE) The eGFR has been calculated using the CKD EPI equation. This calculation has not been validated in all clinical situations. eGFR's persistently <60 mL/min signify possible Chronic Kidney Disease.   . Anion gap 10/01/2015 10  5 - 15 Final     STUDIES: No results found.  ASSESSMENT:  Adenocarcinoma of the left upper lobe, stage IIA, T3 N0 M0.  PLAN:   1. Left upper lobe lung cancer. Patient originally had 2 different nodules in the same lung that were consistent with adenocarcinoma with some visceral pleural involvement. Patient is status post lobectomy in 2015 as well as adjuvant chemotherapy. She overall is feeling very well and clinically there is no evidence of recurrent or progressive disease. Her only complaint at this time is some issues breathing through her nose and she is having surgery to correct a deviated septum later in the year with ENT. Patient also having some scattered increasing pigmented areas over her bilateral lower extremities. Some areas appear to be scaly and dry, but do not itch. Advised her to see a dermatologist so that biopsy could be performed.  Advised patient that we would get a CT scan of the chest upon follow-up in approximately 6 months.  Patient expressed understanding and was in agreement with this plan. She also understands that She can call clinic at any time with any questions, concerns, or complaints.   Dr. Oliva Bustard was available for consultation and review of plan of care for this patient.  Lung cancer [facility])   Staging form: Lung, AJCC 7th Edition     Clinical: Stage IIB (yT3, N0, M0) - Unsigned   Evlyn Kanner, NP   10/01/2015 1:55 PM

## 2015-10-01 NOTE — Progress Notes (Signed)
Patient here today for follow up regarding lung cancer. Patient reports increase in shortness of breath, still has pain to left side. Patient denies any other concerns.

## 2015-10-05 ENCOUNTER — Encounter: Payer: Self-pay | Admitting: Family Medicine

## 2015-10-05 ENCOUNTER — Ambulatory Visit (INDEPENDENT_AMBULATORY_CARE_PROVIDER_SITE_OTHER): Payer: BLUE CROSS/BLUE SHIELD | Admitting: Family Medicine

## 2015-10-05 VITALS — BP 135/84 | HR 102 | Temp 98.2°F | Ht 62.0 in | Wt 215.0 lb

## 2015-10-05 DIAGNOSIS — L723 Sebaceous cyst: Secondary | ICD-10-CM | POA: Diagnosis not present

## 2015-10-05 DIAGNOSIS — G43909 Migraine, unspecified, not intractable, without status migrainosus: Secondary | ICD-10-CM

## 2015-10-05 MED ORDER — RIZATRIPTAN BENZOATE 10 MG PO TABS: 10.0000 mg | ORAL_TABLET | ORAL | Status: DC | PRN

## 2015-10-05 NOTE — Assessment & Plan Note (Signed)
Discuss migraine headache care and treatment will try Maxalt

## 2015-10-05 NOTE — Progress Notes (Signed)
BP 135/84 mmHg  Pulse 102  Temp(Src) 98.2 F (36.8 C)  Ht '5\' 2"'$  (1.575 m)  Wt 215 lb (97.523 kg)  BMI 39.31 kg/m2  SpO2 99%   Subjective:    Patient ID: Natalie Stark, female    DOB: 04/23/1961, 55 y.o.   MRN: [id number]  HPI: Natalie Stark is a 55 y.o. female  Chief Complaint  Patient presents with  . Cyst    left ear  Patient with sebaceous cyst left earlobe bothersome gets occasionally infected gets larger patient has recently expressed and had some drainage from the cyst. Requesting removal  Patient also on Imitrex isn't helping migraine headaches wants to try something else has had Maxalt previously which seemed to help.  Relevant past medical, surgical, family and social history reviewed and updated as indicated. Interim medical history since our last visit reviewed. Allergies and medications reviewed and updated.  Review of Systems  Constitutional: Negative.   Respiratory: Negative.   Cardiovascular: Negative.     Per HPI unless specifically indicated above     Objective:    BP 135/84 mmHg  Pulse 102  Temp(Src) 98.2 F (36.8 C)  Ht '5\' 2"'$  (1.575 m)  Wt 215 lb (97.523 kg)  BMI 39.31 kg/m2  SpO2 99%  Wt Readings from Last 3 Encounters:  10/05/15 215 lb (97.523 kg)  10/01/15 214 lb 1.1 oz (97.1 kg)  09/23/15 219 lb (99.338 kg)    Physical Exam  Constitutional: She is oriented to person, place, and time. She appears well-developed and well-nourished. No distress.  HENT:  Head: Normocephalic and atraumatic.  Right Ear: Hearing normal.  Left Ear: Hearing normal.  Nose: Nose normal.  Eyes: Conjunctivae and lids are normal. Right eye exhibits no discharge. Left eye exhibits no discharge. No scleral icterus.  Pulmonary/Chest: Effort normal. No respiratory distress.  Musculoskeletal: Normal range of motion.  Neurological: She is alert and oriented to person, place, and time.  Skin: Skin is intact. No rash noted.  Small sebaceous cyst posterior  left earlobe areas prepped with Betadine and alcohol and infiltrated with Xylocaine without epinephrine I&D performed small amount of sebaceous material expressed no sac removed. Patient tolerated procedure well and was performed after informed consent.  Psychiatric: She has a normal mood and affect. Her speech is normal and behavior is normal. Judgment and thought content normal. Cognition and memory are normal.    Results for orders placed or performed in visit on 10/01/15  CBC with Differential  Result Value Ref Range   WBC 11.8 (H) 3.6 - 11.0 K/uL   RBC 4.73 3.80 - 5.20 MIL/uL   Hemoglobin 13.6 12.0 - 16.0 g/dL   HCT 41.0 35.0 - 47.0 %   MCV 86.8 80.0 - 100.0 fL   MCH 28.8 26.0 - 34.0 pg   MCHC 33.2 32.0 - 36.0 g/dL   RDW 13.4 11.5 - 14.5 %   Platelets 290 150 - 440 K/uL   Neutrophils Relative % 60 %   Neutro Abs 7.1 (H) 1.4 - 6.5 K/uL   Lymphocytes Relative 29 %   Lymphs Abs 3.4 1.0 - 3.6 K/uL   Monocytes Relative 5 %   Monocytes Absolute 0.6 0.2 - 0.9 K/uL   Eosinophils Relative 5 %   Eosinophils Absolute 0.6 0 - 0.7 K/uL   Basophils Relative 1 %   Basophils Absolute 0.1 0 - 0.1 K/uL  Comprehensive metabolic panel  Result Value Ref Range   Sodium 140 135 - 145 mmol/L  Potassium 4.0 3.5 - 5.1 mmol/L   Chloride 102 101 - 111 mmol/L   CO2 28 22 - 32 mmol/L   Glucose, Bld 125 (H) 65 - 99 mg/dL   BUN 10 6 - 20 mg/dL   Creatinine, Ser 0.70 0.44 - 1.00 mg/dL   Calcium 9.0 8.9 - 10.3 mg/dL   Total Protein 7.7 6.5 - 8.1 g/dL   Albumin 4.1 3.5 - 5.0 g/dL   AST 28 15 - 41 U/L   ALT 24 14 - 54 U/L   Alkaline Phosphatase 85 38 - 126 U/L   Total Bilirubin 0.5 0.3 - 1.2 mg/dL   GFR calc non Af Amer >60 >60 mL/min   GFR calc Af Amer >60 >60 mL/min   Anion gap 10 5 - 15      Assessment & Plan:   Problem List Items Addressed This Visit      Cardiovascular and Mediastinum   Migraine - Primary    Discuss migraine headache care and treatment will try Maxalt      Relevant  Medications   rizatriptan (MAXALT) 10 MG tablet    Other Visit Diagnoses    Sebaceous cyst        Left ear I&D see procedure note        Follow up plan: Return for As scheduled.

## 2015-12-02 ENCOUNTER — Other Ambulatory Visit: Payer: Self-pay | Admitting: Family Medicine

## 2015-12-02 ENCOUNTER — Telehealth: Payer: Self-pay | Admitting: Family Medicine

## 2015-12-02 MED ORDER — OXYCODONE HCL 15 MG PO TABS: 15.0000 mg | ORAL_TABLET | Freq: Four times a day (QID) | ORAL | Status: DC | PRN

## 2015-12-02 NOTE — Telephone Encounter (Signed)
Received phone call from patient today requesting a refill of pain medication, oxycodone 15 mg every 6 hours as needed. Patient has been going to pain clinic in [place], [facility], where they have been prescribing said medication but she reports feeling very uncomfortable going there as they are not assessing her, properly handling her urine specimens, and has recently either discharged patient from clinic or refused to refill her pain medications.  I have contacted [facility], and no information has been collected. Agreed with patient to refill for a limited supply.  Patient will need to establish with another pain management agency for any further refills.

## 2015-12-03 ENCOUNTER — Telehealth: Payer: Self-pay

## 2015-12-03 NOTE — Telephone Encounter (Signed)
Patient picked up Rx for 15 mg oxycodone, was written for 60 Q 6 hours but patient wanted it for the full month which would be 120.  Talked to [facility] and she stated that patient was not going to receive any more than what was written as she is not under care of pain clinic and is in active treatment at cancer center

## 2015-12-29 ENCOUNTER — Telehealth: Payer: Self-pay | Admitting: Cardiovascular Disease

## 2015-12-29 NOTE — Telephone Encounter (Signed)
Pt calling stating back in oct 2016 she wore an event monitor but was never called back with Results Please advise.

## 2015-12-30 NOTE — Telephone Encounter (Signed)
Monitor results from [facility] were never received. They have now been downloaded and sent to Dr. Elmarie Shiley in basket for review. Notified patient who is appreciative of the call. She understands I will call her with results as soon as read by MD.

## 2015-12-31 DIAGNOSIS — M25569 Pain in unspecified knee: Secondary | ICD-10-CM | POA: Insufficient documentation

## 2015-12-31 NOTE — Telephone Encounter (Signed)
Pt notified of results. Letter mailed per request of patient.

## 2016-01-06 ENCOUNTER — Encounter: Payer: BLUE CROSS/BLUE SHIELD | Admitting: Family Medicine

## 2016-01-06 ENCOUNTER — Encounter: Payer: Self-pay | Admitting: *Deleted

## 2016-01-06 NOTE — Discharge Instructions (Signed)
[facility] °[facility] °ENDOSCOPIC SINUS SURGERY °[facility] ° °What is Functional Endoscopic Sinus Surgery? ° The Surgery involves making the natural openings of the sinuses larger by removing the bony partitions that separate the sinuses from the nasal cavity.  The natural sinus lining is preserved as much as possible to allow the sinuses to resume normal function after the surgery.  In some patients nasal polyps (excessively swollen lining of the sinuses) may be removed to relieve obstruction of the sinus openings.  The surgery is performed through the nose using lighted scopes, which eliminates the need for incisions on the face.  A septoplasty is a different procedure which is sometimes performed with sinus surgery.  It involves straightening the boy partition that separates the two sides of your nose.  A crooked or deviated septum may need repair if is obstructing the sinuses or nasal airflow.  Turbinate reduction is also often performed during sinus surgery.  The turbinates are bony proturberances from the side walls of the nose which swell and can obstruct the nose in patients with sinus and allergy problems.  Their size can be surgically reduced to help relieve nasal obstruction. ° °What Can Sinus Surgery Do For Me? ° Sinus surgery can reduce the frequency of sinus infections requiring antibiotic treatment.  This can provide improvement in nasal congestion, post-nasal drainage, facial pressure and nasal obstruction.  Surgery will NOT prevent you from ever having an infection again, so it usually only for patients who get infections 4 or more times yearly requiring antibiotics, or for infections that do not clear with antibiotics.  It will not cure nasal allergies, so patients with allergies may still require medication to treat their allergies after surgery. Surgery may improve headaches related to sinusitis, however, some people will continue to  require medication to control sinus headaches related to allergies.  Surgery will do nothing for other forms of headache (migraine, tension or cluster). ° °What Are the Risks of Endoscopic Sinus Surgery? ° Current techniques allow surgery to be performed safely with little risk, however, there are rare complications that patients should be aware of.  Because the sinuses are located around the eyes, there is risk of eye injury, including blindness, though again, this would be quite rare. This is usually a result of bleeding behind the eye during surgery, which puts the vision oat risk, though there are treatments to protect the vision and prevent permanent disrupted by surgery causing a leak of the spinal fluid that surrounds the brain.  More serious complications would include bleeding inside the brain cavity or damage to the brain.  Again, all of these complications are uncommon, and spinal fluid leaks can be safely managed surgically if they occur.  The most common complication of sinus surgery is bleeding from the nose, which may require packing or cauterization of the nose.  Continued sinus have polyps may experience recurrence of the polyps requiring revision surgery.  Alterations of sense of smell or injury to the tear ducts are also rare complications.  ° °What is the Surgery Like, and what is the Recovery? ° The Surgery usually takes a couple of hours to perform, and is usually performed under a general anesthetic (completely asleep).  Patients are usually discharged home after a couple of hours.  Sometimes during surgery it is necessary to pack the nose to control bleeding, and the packing is left in place for 24 - 48 hours, and removed by your surgeon.    If a septoplasty was performed during the procedure, there is often a splint placed which must be removed after 5-7 days.   °Discomfort: Pain is usually mild to moderate, and can be controlled by prescription pain medication or acetaminophen (Tylenol).   Aspirin, Ibuprofen (Advil, Motrin), or Naprosyn (Aleve) should be avoided, as they can cause increased bleeding.  Most patients feel sinus pressure like they have a bad head cold for several days.  Sleeping with your head elevated can help reduce swelling and facial pressure, as can ice packs over the face.  A humidifier may be helpful to keep the mucous and blood from drying in the nose.  ° °Diet: There are no specific diet restrictions, however, you should generally start with clear liquids and a light diet of bland foods because the anesthetic can cause some nausea.  Advance your diet depending on how your stomach feels.  Taking your pain medication with food will often help reduce stomach upset which pain medications can cause. ° °Nasal Saline Irrigation: It is important to remove blood clots and dried mucous from the nose as it is healing.  This is done by having you irrigate the nose at least 3 - 4 times daily with a salt water solution.  We recommend using NeilMed Sinus Rinse (available at the drug store).  Fill the squeeze bottle with the solution, bend over a sink, and insert the tip of the squeeze bottle into the nose ½ of an inch.  Point the tip of the squeeze bottle towards the inside corner of the eye on the same side your irrigating.  Squeeze the bottle and gently irrigate the nose.  If you bend forward as you do this, most of the fluid will flow back out of the nose, instead of down your throat.   The solution should be warm, near body temperature, when you irrigate.   Each time you irrigate, you should use a full squeeze bottle.  ° °Note that if you are instructed to use Nasal Steroid Sprays at any time after your surgery, irrigate with saline BEFORE using the steroid spray, so you do not wash it all out of the nose. °Another product, Nasal Saline Gel (such as AYR Nasal Saline Gel) can be applied in each nostril 3 - 4 times daily to moisture the nose and reduce scabbing or crusting. ° °Bleeding:   Bloody drainage from the nose can be expected for several days, and patients are instructed to irrigate their nose frequently with salt water to help remove mucous and blood clots.  The drainage may be dark red or brown, though some fresh blood may be seen intermittently, especially after irrigation.  Do not blow you nose, as bleeding may occur. If you must sneeze, keep your mouth open to allow air to escape through your mouth. ° °If heavy bleeding occurs: Irrigate the nose with saline to rinse out clots, then spray the nose 3 - 4 times with Afrin Nasal Decongestant Spray.  The spray will constrict the blood vessels to slow bleeding.  Pinch the lower half of your nose shut to apply pressure, and lay down with your head elevated.  Ice packs over the nose may help as well. If bleeding persists despite these measures, you should notify your doctor.  Do not use the Afrin routinely to control nasal congestion after surgery, as it can result in worsening congestion and may affect healing.  ° ° ° °Activity: Return to work varies among patients. Most patients will be   out of work at least 5 - 7 days to recover.  Patient may return to work after they are off of narcotic pain medication, and feeling well enough to perform the functions of their job.  Patients must avoid heavy lifting (over 10 pounds) or strenuous physical for 2 weeks after surgery, so your employer may need to assign you to light duty, or keep you out of work longer if light duty is not possible.  NOTE: you should not drive, operate dangerous machinery, do any mentally demanding tasks or make any important legal or financial decisions while on narcotic pain medication and recovering from the general anesthetic.  °  °Call Your Doctor Immediately if You Have Any of the Following: °1. Bleeding that you cannot control with the above measures °2. Loss of vision, double vision, bulging of the eye or black eyes. °3. Fever over 101 degrees °4. Neck stiffness with  severe headache, fever, nausea and change in mental state. °You are always encourage to call anytime with concerns, however, please call with requests for pain medication refills during office hours. ° °Office Endoscopy: During follow-up visits your doctor will remove any packing or splints that may have been placed and evaluate and clean your sinuses endoscopically.  Topical anesthetic will be used to make this as comfortable as possible, though you may want to take your pain medication prior to the visit.  How often this will need to be done varies from patient to patient.  After complete recovery from the surgery, you may need follow-up endoscopy from time to time, particularly if there is concern of recurrent infection or nasal polyps. ° °General Anesthesia, Adult, Care After °Refer to this sheet in the next few weeks. These instructions provide you with information on caring for yourself after your procedure. Your health care provider may also give you more specific instructions. Your treatment has been planned according to current medical practices, but problems sometimes occur. Call your health care provider if you have any problems or questions after your procedure. °WHAT TO EXPECT AFTER THE PROCEDURE °After the procedure, it is typical to experience: °· Sleepiness. °· Nausea and vomiting. °HOME CARE INSTRUCTIONS °· For the first 24 hours after general anesthesia: °¨ Have a responsible person with you. °¨ Do not drive a car. If you are alone, do not take public transportation. °¨ Do not drink alcohol. °¨ Do not take medicine that has not been prescribed by your health care provider. °¨ Do not sign important papers or make important decisions. °¨ You may resume a normal diet and activities as directed by your health care provider. °· Change bandages (dressings) as directed. °· If you have questions or problems that seem related to general anesthesia, call the hospital and ask for the anesthetist or  anesthesiologist on call. °SEEK MEDICAL CARE IF: °· You have nausea and vomiting that continue the day after anesthesia. °· You develop a rash. °SEEK IMMEDIATE MEDICAL CARE IF:  °· You have difficulty breathing. °· You have chest pain. °· You have any allergic problems. °  °This information is not intended to replace advice given to you by your health care provider. Make sure you discuss any questions you have with your health care provider. °  °Document Released: 08/15/2000 Document Revised: 05/30/2014 Document Reviewed: 09/07/2011 °Elsevier Interactive Patient Education ©2016 [employer] ° °

## 2016-01-08 ENCOUNTER — Ambulatory Visit: Payer: BLUE CROSS/BLUE SHIELD | Admitting: Anesthesiology

## 2016-01-08 ENCOUNTER — Encounter
Admission: RE | Disposition: A | Discharge status: Home / Self Care | Payer: Self-pay | Source: Ambulatory Visit | Attending: Unknown Physician Specialty

## 2016-01-08 ENCOUNTER — Ambulatory Visit
Admission: RE | Admit: 2016-01-08 | Discharge: 2016-01-08 | Disposition: A | Discharge status: Home / Self Care | Payer: BLUE CROSS/BLUE SHIELD | Source: Ambulatory Visit | Attending: Unknown Physician Specialty | Admitting: Unknown Physician Specialty

## 2016-01-08 DIAGNOSIS — Z888 Allergy status to other drugs, medicaments and biological substances status: Secondary | ICD-10-CM | POA: Diagnosis not present

## 2016-01-08 DIAGNOSIS — K219 Gastro-esophageal reflux disease without esophagitis: Secondary | ICD-10-CM | POA: Diagnosis not present

## 2016-01-08 DIAGNOSIS — J342 Deviated nasal septum: Secondary | ICD-10-CM | POA: Insufficient documentation

## 2016-01-08 DIAGNOSIS — J343 Hypertrophy of nasal turbinates: Secondary | ICD-10-CM | POA: Diagnosis not present

## 2016-01-08 DIAGNOSIS — Z85118 Personal history of other malignant neoplasm of bronchus and lung: Secondary | ICD-10-CM | POA: Insufficient documentation

## 2016-01-08 DIAGNOSIS — F17200 Nicotine dependence, unspecified, uncomplicated: Secondary | ICD-10-CM | POA: Diagnosis not present

## 2016-01-08 DIAGNOSIS — I10 Essential (primary) hypertension: Secondary | ICD-10-CM | POA: Diagnosis not present

## 2016-01-08 DIAGNOSIS — J3489 Other specified disorders of nose and nasal sinuses: Secondary | ICD-10-CM | POA: Diagnosis present

## 2016-01-08 HISTORY — PX: SEPTOPLASTY: SHX2393

## 2016-01-08 HISTORY — DX: Gastro-esophageal reflux disease without esophagitis: K21.9

## 2016-01-08 HISTORY — PX: NASAL TURBINATE REDUCTION: SHX2072

## 2016-01-08 HISTORY — DX: Other chronic pain: G89.29

## 2016-01-08 SURGERY — SEPTOPLASTY, NOSE
Anesthesia: General | Site: Nose | Laterality: Bilateral | Wound class: Clean Contaminated

## 2016-01-08 MED ORDER — OXYCODONE-ACETAMINOPHEN 5-325 MG PO TABS: 1.0000 | ORAL_TABLET | ORAL | 0 refills | Status: DC | PRN

## 2016-01-08 MED ORDER — ACETAMINOPHEN 10 MG/ML IV SOLN
1000.0000 mg | Freq: Once | INTRAVENOUS | Status: AC
  Administered 2016-01-08: 1000 mg via INTRAVENOUS

## 2016-01-08 MED ORDER — LACTATED RINGERS IV SOLN
INTRAVENOUS | Status: DC
  Administered 2016-01-08: 10:00:00 via INTRAVENOUS

## 2016-01-08 MED ORDER — PROPOFOL 10 MG/ML IV BOLUS
INTRAVENOUS | Status: DC | PRN
  Administered 2016-01-08: 50 mg via INTRAVENOUS
  Administered 2016-01-08: 150 mg via INTRAVENOUS

## 2016-01-08 MED ORDER — SUCCINYLCHOLINE CHLORIDE 20 MG/ML IJ SOLN
INTRAMUSCULAR | Status: DC | PRN
  Administered 2016-01-08: 100 mg via INTRAVENOUS

## 2016-01-08 MED ORDER — ONDANSETRON HCL 4 MG/2ML IJ SOLN
INTRAMUSCULAR | Status: DC | PRN
  Administered 2016-01-08: 4 mg via INTRAVENOUS

## 2016-01-08 MED ORDER — OXYMETAZOLINE HCL 0.05 % NA SOLN
6.0000 | Freq: Once | NASAL | Status: AC
  Administered 2016-01-08: 6 via NASAL

## 2016-01-08 MED ORDER — BACITRACIN ZINC 500 UNIT/GM EX OINT
TOPICAL_OINTMENT | CUTANEOUS | Status: DC | PRN
  Administered 2016-01-08: 1 via TOPICAL

## 2016-01-08 MED ORDER — ONDANSETRON HCL 4 MG/2ML IJ SOLN: 4.0000 mg | Freq: Once | INTRAMUSCULAR | Status: DC | PRN

## 2016-01-08 MED ORDER — LIDOCAINE HCL (CARDIAC) 20 MG/ML IV SOLN
INTRAVENOUS | Status: DC | PRN
  Administered 2016-01-08: 40 mg via INTRAVENOUS

## 2016-01-08 MED ORDER — MIDAZOLAM HCL 5 MG/5ML IJ SOLN
INTRAMUSCULAR | Status: DC | PRN
  Administered 2016-01-08: 2 mg via INTRAVENOUS

## 2016-01-08 MED ORDER — PHENYLEPHRINE HCL 0.5 % NA SOLN
NASAL | Status: DC | PRN
  Administered 2016-01-08: 30 mL via TOPICAL

## 2016-01-08 MED ORDER — SCOPOLAMINE 1 MG/3DAYS TD PT72
1.0000 | MEDICATED_PATCH | Freq: Once | TRANSDERMAL | Status: DC
  Administered 2016-01-08: 1.5 mg via TRANSDERMAL

## 2016-01-08 MED ORDER — FENTANYL CITRATE (PF) 100 MCG/2ML IJ SOLN: 25.0000 ug | INTRAMUSCULAR | Status: DC | PRN

## 2016-01-08 MED ORDER — OXYCODONE HCL 5 MG PO TABS
5.0000 mg | ORAL_TABLET | Freq: Four times a day (QID) | ORAL | Status: DC | PRN
  Administered 2016-01-08: 10 mg via ORAL

## 2016-01-08 MED ORDER — FENTANYL CITRATE (PF) 100 MCG/2ML IJ SOLN
INTRAMUSCULAR | Status: DC | PRN
  Administered 2016-01-08: 50 ug via INTRAVENOUS
  Administered 2016-01-08: 100 ug via INTRAVENOUS

## 2016-01-08 MED ORDER — LIDOCAINE-EPINEPHRINE 1 %-1:100000 IJ SOLN
INTRAMUSCULAR | Status: DC | PRN
  Administered 2016-01-08: 13 mL

## 2016-01-08 MED ORDER — DEXAMETHASONE SODIUM PHOSPHATE 4 MG/ML IJ SOLN
INTRAMUSCULAR | Status: DC | PRN
  Administered 2016-01-08: 8 mg via INTRAVENOUS

## 2016-01-08 MED ORDER — SULFAMETHOXAZOLE-TRIMETHOPRIM 800-160 MG PO TABS: 1.0000 | ORAL_TABLET | Freq: Two times a day (BID) | ORAL | 0 refills | Status: DC

## 2016-01-08 SURGICAL SUPPLY — 30 items
BLADE SURG 15 STRL LF DISP TIS (BLADE) IMPLANT
BLADE SURG 15 STRL SS (BLADE)
COAG SUCT 10F 3.5MM HAND CTRL (MISCELLANEOUS) ×3 IMPLANT
DRAPE HEAD BAR (DRAPES) ×3 IMPLANT
DRESSING NASL FOAM PST OP SINU (MISCELLANEOUS) ×1 IMPLANT
DRSG NASAL FOAM POST OP SINU (MISCELLANEOUS) ×3
GLOVE BIO SURGEON STRL SZ7.5 (GLOVE) ×9 IMPLANT
HANDLE YANKAUER SUCT BULB TIP (MISCELLANEOUS) ×3 IMPLANT
KIT ROOM TURNOVER OR (KITS) ×3 IMPLANT
NEEDLE HYPO 25GX1X1/2 BEV (NEEDLE) ×3 IMPLANT
NS IRRIG 500ML POUR BTL (IV SOLUTION) IMPLANT
PACK DRAPE NASAL/ENT (PACKS) ×3 IMPLANT
PAD GROUND ADULT SPLIT (MISCELLANEOUS) ×3 IMPLANT
SOL ANTI-FOG 6CC FOG-OUT (MISCELLANEOUS) ×1 IMPLANT
SOL FOG-OUT ANTI-FOG 6CC (MISCELLANEOUS) ×2
SPLINT NASAL SEPTAL BLV .25 LG (MISCELLANEOUS) IMPLANT
SPLINT NASAL SEPTAL BLV .50 ST (MISCELLANEOUS) ×3 IMPLANT
SPONGE NEURO XRAY DETECT 1X3 (DISPOSABLE) ×3 IMPLANT
STRAP BODY AND KNEE 60X3 (MISCELLANEOUS) ×3 IMPLANT
SUT CHROMIC 3-0 (SUTURE) ×2
SUT CHROMIC 3-0 KS 27XMFL CR (SUTURE) ×1
SUT CHROMIC 5-0 (SUTURE)
SUT CHROMIC 5-0 P2 18XMFL CR (SUTURE)
SUT ETHILON 3-0 KS 30 BLK (SUTURE) ×3 IMPLANT
SUT PLAIN GUT 4-0 (SUTURE) IMPLANT
SUTURE CHRMC 3-0 KS 27XMFL CR (SUTURE) ×1 IMPLANT
SUTURE CHRMC 5-0 P2 18XMF CR (SUTURE) IMPLANT
SYRINGE 10CC LL (SYRINGE) ×3 IMPLANT
TOWEL OR 17X26 4PK STRL BLUE (TOWEL DISPOSABLE) ×3 IMPLANT
WATER STERILE IRR 500ML POUR (IV SOLUTION) ×3 IMPLANT

## 2016-01-08 NOTE — Op Note (Signed)
PREOPERATIVE DIAGNOSIS:  Chronic nasal obstruction.  POSTOPERATIVE DIAGNOSIS:  Chronic nasal obstruction.  SURGEON:  Roena Malady, M.D.  NAME OF PROCEDURE:  1. Nasal septoplasty. 2. Submucous resection of inferior turbinates.  OPERATIVE FINDINGS:  Severe nasal septal deformity, hypertrophy of the inferior turbinates.   DESCRIPTION OF THE PROCEDURE:  Natalie Stark was identified in the holding area and taken to the operating room and placed in the supine position.  After general endotracheal anesthesia was induced, the table was turned 45 degrees and the patient was placed in a semi-Fowler position.  The nose was then topically anesthetized with Lidocaine, cotton pledgets were placed within each nostril. After approximately 5 minutes, this was removed at which time a local anesthetic of 1% Lidocaine 1:100,000 units of Epinephrine was used to inject the inferior turbinates in the nasal septum. A total of 13 ml was used. Examination of the nose showed a severe left nasal septal deformity and tremendous hypertrophied inferior turbinate.  Beginning on the right hand side a hemitransfixion incision was then created on the leading edge of the septum on the right.  A subperichondrial plane was elevated posteriorly on the left and taken back to the perpendicular plate of the ethmoid where subperiosteal plane was elevated posteriorly on the left. A large septal spur was identified on the left hand side impacting on the inferior turbinate.  An inferior rim of cartilage was removed anteriorly with care taken to leave an anterior strut to prevent nasal collapse. With this strut removed the perpendicular plate of the ethmoid was separated from the quadrangular cartilage. The large septal spur was removed.  The septum was then replaced in the midline. Reinspection through each nostril showed excellent reduction of the septal deformity. A left posterior inferior fenestration was then created to allow  hematoma drainage.  With the septoplasty completed, beginning on the left-hand side, a 15 blade was used to incise along the inferior edge of the inferior turbinate. A superior laterally based flap was then elevated. The underlying conchal bone of mucosa was excised using Knight scissors. The flap was then laid back over the turbinate stump and cauterized using suction cautery. In a similar fashion the submucous resection was performed on the right.  With the submucous resection completed bilaterally and no active bleeding, the hemitransfixion incision was then closed using two interrupted 3-0 chromic sutures.  Plastic nasal septal splints were placed within each nostril and affixed to the septum using a 3-0 nylon suture. Stammberger was then used beneath each inferior turbinate for hemostasis.    The patient tolerated the procedure well, was returned to anesthesia, extubated in the operating room, and taken to the recovery room in stable condition.    CULTURES:  None.  SPECIMENS:  None.  ESTIMATED BLOOD LOSS:  25 cc.  [name] T  01/08/2016  12:34 PM

## 2016-01-08 NOTE — Anesthesia Postprocedure Evaluation (Signed)
Anesthesia Post Note  Patient: [name]  Procedure(s) Performed: Procedure(s) (LRB): SEPTOPLASTY (Bilateral) TURBINATE REDUCTION/SUBMUCOSAL RESECTION (Bilateral)  Patient location during evaluation: PACU Anesthesia Type: General Level of consciousness: awake and alert Pain management: pain level controlled Vital Signs Assessment: post-procedure vital signs reviewed and stable Respiratory status: spontaneous breathing, nonlabored ventilation, respiratory function stable and patient connected to nasal cannula oxygen Cardiovascular status: blood pressure returned to baseline and stable Postop Assessment: no signs of nausea or vomiting Anesthetic complications: no    Verba Ainley

## 2016-01-08 NOTE — Anesthesia Procedure Notes (Addendum)
Procedure Name: Intubation Date/Time: 01/08/2016 12:07 PM Performed by: Londell Moh Pre-anesthesia Checklist: Patient identified, Emergency Drugs available, Suction available, Patient being monitored and Timeout performed Patient Re-evaluated:Patient Re-evaluated prior to inductionOxygen Delivery Method: Circle system utilized Preoxygenation: Pre-oxygenation with 100% oxygen Intubation Type: IV induction Ventilation: Mask ventilation without difficulty Laryngoscope Size: Mac and 3 Grade View: Grade II Tube type: Oral Rae Tube size: 7.0 mm Number of attempts: 1 Airway Equipment and Method: Stylet and LTA kit utilized Placement Confirmation: ETT inserted through vocal cords under direct vision,  positive ETCO2 and breath sounds checked- equal and bilateral Tube secured with: Tape Dental Injury: Teeth and Oropharynx as per pre-operative assessment

## 2016-01-08 NOTE — Transfer of Care (Signed)
Immediate Anesthesia Transfer of Care Note  Patient: Natalie Stark  Procedure(s) Performed: Procedure(s): SEPTOPLASTY (Bilateral) TURBINATE REDUCTION/SUBMUCOSAL RESECTION (Bilateral)  Patient Location: PACU  Anesthesia Type: General ETT  Level of Consciousness: awake, alert  and patient cooperative  Airway and Oxygen Therapy: Patient Spontanous Breathing and Patient connected to supplemental oxygen  Post-op Assessment: Post-op Vital signs reviewed, Patient's Cardiovascular Status Stable, Respiratory Function Stable, Patent Airway and No signs of Nausea or vomiting  Post-op Vital Signs: Reviewed and stable  Complications: No apparent anesthesia complications

## 2016-01-08 NOTE — H&P (Signed)
  H+P  Reviewed and will be scanned in later. No changes noted. 

## 2016-01-08 NOTE — Anesthesia Preprocedure Evaluation (Signed)
Anesthesia Evaluation  Patient identified by MRN, date of birth, ID band Patient awake    Reviewed: Allergy & Precautions, H&P , NPO status , Patient's Chart, lab work & pertinent test results  Airway Mallampati: II  TM Distance: >3 FB Neck ROM: full    Dental no notable dental hx.    Pulmonary Current Smoker,  H/o Lung CA, s/p LU lobectomy   Pulmonary exam normal        Cardiovascular hypertension, Normal cardiovascular exam     Neuro/Psych    GI/Hepatic GERD  Medicated,  Endo/Other    Renal/GU      Musculoskeletal   Abdominal   Peds  Hematology   Anesthesia Other Findings   Reproductive/Obstetrics                             Anesthesia Physical Anesthesia Plan  ASA: III  Anesthesia Plan: General ETT   Post-op Pain Management:    Induction: Intravenous  Airway Management Planned: Oral ETT  Additional Equipment:   Intra-op Plan:   Post-operative Plan:   Informed Consent: I have reviewed the patients History and Physical, chart, labs and discussed the procedure including the risks, benefits and alternatives for the proposed anesthesia with the patient or authorized representative who has indicated his/her understanding and acceptance.   Dental advisory given  Plan Discussed with:   Anesthesia Plan Comments:         Anesthesia Quick Evaluation

## 2016-01-11 ENCOUNTER — Encounter: Payer: Self-pay | Admitting: Unknown Physician Specialty

## 2016-03-18 ENCOUNTER — Encounter: Payer: Self-pay | Admitting: *Deleted

## 2016-03-18 ENCOUNTER — Telehealth: Payer: Self-pay | Admitting: *Deleted

## 2016-03-18 NOTE — Telephone Encounter (Signed)
Needs our opinion on her having knee surgery

## 2016-03-18 NOTE — Telephone Encounter (Signed)
Per VO Dr Grayland Ormond, from an oncology standpoint, no problem with her having surgery

## 2016-03-25 ENCOUNTER — Ambulatory Visit
Admission: RE | Admit: 2016-03-25 | Discharge: 2016-03-25 | Disposition: A | Discharge status: Home / Self Care | Payer: BLUE CROSS/BLUE SHIELD | Source: Ambulatory Visit | Attending: Family Medicine | Admitting: Family Medicine

## 2016-03-25 DIAGNOSIS — Z85118 Personal history of other malignant neoplasm of bronchus and lung: Secondary | ICD-10-CM | POA: Diagnosis not present

## 2016-03-25 DIAGNOSIS — Z08 Encounter for follow-up examination after completed treatment for malignant neoplasm: Secondary | ICD-10-CM | POA: Diagnosis present

## 2016-03-25 DIAGNOSIS — Z902 Acquired absence of lung [part of]: Secondary | ICD-10-CM | POA: Diagnosis not present

## 2016-03-25 DIAGNOSIS — C3492 Malignant neoplasm of unspecified part of left bronchus or lung: Secondary | ICD-10-CM

## 2016-03-25 DIAGNOSIS — Z9221 Personal history of antineoplastic chemotherapy: Secondary | ICD-10-CM | POA: Insufficient documentation

## 2016-03-25 DIAGNOSIS — Z9049 Acquired absence of other specified parts of digestive tract: Secondary | ICD-10-CM | POA: Insufficient documentation

## 2016-03-25 MED ORDER — IOPAMIDOL (ISOVUE-300) INJECTION 61%
75.0000 mL | Freq: Once | INTRAVENOUS | Status: AC | PRN
Start: 2016-03-25 — End: 2016-03-25
  Administered 2016-03-25: 75 mL via INTRAVENOUS

## 2016-03-29 LAB — POCT I-STAT CREATININE: Creatinine, Ser: 0.9 mg/dL (ref 0.44–1.00)

## 2016-04-01 ENCOUNTER — Other Ambulatory Visit: Payer: BLUE CROSS/BLUE SHIELD

## 2016-04-01 ENCOUNTER — Ambulatory Visit: Payer: BLUE CROSS/BLUE SHIELD | Admitting: Family Medicine

## 2016-04-04 ENCOUNTER — Inpatient Hospital Stay: Payer: BLUE CROSS/BLUE SHIELD

## 2016-04-05 ENCOUNTER — Inpatient Hospital Stay: Payer: BLUE CROSS/BLUE SHIELD | Attending: Internal Medicine

## 2016-04-05 ENCOUNTER — Inpatient Hospital Stay (HOSPITAL_BASED_OUTPATIENT_CLINIC_OR_DEPARTMENT_OTHER): Payer: BLUE CROSS/BLUE SHIELD | Admitting: Internal Medicine

## 2016-04-05 ENCOUNTER — Ambulatory Visit
Admission: RE | Admit: 2016-04-05 | Discharge: 2016-04-05 | Disposition: A | Discharge status: Home / Self Care | Payer: BLUE CROSS/BLUE SHIELD | Source: Ambulatory Visit | Attending: Internal Medicine | Admitting: Internal Medicine

## 2016-04-05 VITALS — BP 137/84 | HR 106 | Temp 96.4°F | Wt 220.7 lb

## 2016-04-05 DIAGNOSIS — K219 Gastro-esophageal reflux disease without esophagitis: Secondary | ICD-10-CM | POA: Insufficient documentation

## 2016-04-05 DIAGNOSIS — F17210 Nicotine dependence, cigarettes, uncomplicated: Secondary | ICD-10-CM | POA: Diagnosis not present

## 2016-04-05 DIAGNOSIS — C3412 Malignant neoplasm of upper lobe, left bronchus or lung: Secondary | ICD-10-CM | POA: Insufficient documentation

## 2016-04-05 DIAGNOSIS — R093 Abnormal sputum: Secondary | ICD-10-CM | POA: Insufficient documentation

## 2016-04-05 DIAGNOSIS — E876 Hypokalemia: Secondary | ICD-10-CM | POA: Insufficient documentation

## 2016-04-05 DIAGNOSIS — R600 Localized edema: Secondary | ICD-10-CM | POA: Insufficient documentation

## 2016-04-05 DIAGNOSIS — Z79899 Other long term (current) drug therapy: Secondary | ICD-10-CM | POA: Diagnosis not present

## 2016-04-05 DIAGNOSIS — G8928 Other chronic postprocedural pain: Secondary | ICD-10-CM

## 2016-04-05 DIAGNOSIS — C3492 Malignant neoplasm of unspecified part of left bronchus or lung: Secondary | ICD-10-CM

## 2016-04-05 DIAGNOSIS — R05 Cough: Secondary | ICD-10-CM

## 2016-04-05 DIAGNOSIS — Z85828 Personal history of other malignant neoplasm of skin: Secondary | ICD-10-CM | POA: Insufficient documentation

## 2016-04-05 DIAGNOSIS — Z902 Acquired absence of lung [part of]: Secondary | ICD-10-CM | POA: Diagnosis not present

## 2016-04-05 DIAGNOSIS — R918 Other nonspecific abnormal finding of lung field: Secondary | ICD-10-CM | POA: Insufficient documentation

## 2016-04-05 DIAGNOSIS — I10 Essential (primary) hypertension: Secondary | ICD-10-CM | POA: Diagnosis not present

## 2016-04-05 DIAGNOSIS — N83202 Unspecified ovarian cyst, left side: Secondary | ICD-10-CM | POA: Diagnosis not present

## 2016-04-05 DIAGNOSIS — Z792 Long term (current) use of antibiotics: Secondary | ICD-10-CM

## 2016-04-05 DIAGNOSIS — M170 Bilateral primary osteoarthritis of knee: Secondary | ICD-10-CM | POA: Insufficient documentation

## 2016-04-05 DIAGNOSIS — Z9221 Personal history of antineoplastic chemotherapy: Secondary | ICD-10-CM

## 2016-04-05 DIAGNOSIS — C3400 Malignant neoplasm of unspecified main bronchus: Secondary | ICD-10-CM

## 2016-04-05 LAB — CBC WITH DIFFERENTIAL/PLATELET
Basophils Absolute: 0.1 10*3/uL (ref 0–0.1)
Basophils Relative: 1 %
EOS ABS: 0.1 10*3/uL (ref 0–0.7)
EOS PCT: 1 %
HCT: 38.8 % (ref 35.0–47.0)
Hemoglobin: 13.4 g/dL (ref 12.0–16.0)
LYMPHS ABS: 3.1 10*3/uL (ref 1.0–3.6)
LYMPHS PCT: 29 %
MCH: 29.7 pg (ref 26.0–34.0)
MCHC: 34.4 g/dL (ref 32.0–36.0)
MCV: 86.3 fL (ref 80.0–100.0)
MONO ABS: 0.8 10*3/uL (ref 0.2–0.9)
Monocytes Relative: 7 %
Neutro Abs: 6.8 10*3/uL — ABNORMAL HIGH (ref 1.4–6.5)
Neutrophils Relative %: 62 %
PLATELETS: 272 10*3/uL (ref 150–440)
RBC: 4.5 MIL/uL (ref 3.80–5.20)
RDW: 13.3 % (ref 11.5–14.5)
WBC: 11 10*3/uL (ref 3.6–11.0)

## 2016-04-05 LAB — COMPREHENSIVE METABOLIC PANEL
ALT: 21 U/L (ref 14–54)
ANION GAP: 12 (ref 5–15)
AST: 23 U/L (ref 15–41)
Albumin: 4 g/dL (ref 3.5–5.0)
Alkaline Phosphatase: 73 U/L (ref 38–126)
BUN: 11 mg/dL (ref 6–20)
CHLORIDE: 96 mmol/L — AB (ref 101–111)
CO2: 31 mmol/L (ref 22–32)
Calcium: 8.9 mg/dL (ref 8.9–10.3)
Creatinine, Ser: 0.67 mg/dL (ref 0.44–1.00)
Glucose, Bld: 119 mg/dL — ABNORMAL HIGH (ref 65–99)
POTASSIUM: 3.1 mmol/L — AB (ref 3.5–5.1)
SODIUM: 139 mmol/L (ref 135–145)
Total Bilirubin: 0.3 mg/dL (ref 0.3–1.2)
Total Protein: 7.3 g/dL (ref 6.5–8.1)

## 2016-04-05 MED ORDER — POTASSIUM CHLORIDE CRYS ER 20 MEQ PO TBCR: 20.0000 meq | EXTENDED_RELEASE_TABLET | Freq: Two times a day (BID) | ORAL | 0 refills | Status: DC

## 2016-04-05 NOTE — Progress Notes (Signed)
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 She had been staged as stage IIa T3 N0 M0 with a visceral pleural involvement and 2 nodules in the same lobe of the lung. She was negative for ALK mutation EGFR was wild type. She received adjuvant chemotherapy in the form of cisplatin and Alimta following lobectomy.  She denies having any new cough fever bronchitis symptoms sputum production or hemoptysis. She's gained significant amount of weight and she feels short of breath and is significantly impaired by the pain in both knees from arthritis. She also admits to having considerable stress in her personal life. She's been on multiple pain medications including NSAIDs and oxycodone for knee arthritis and is contemplating a knee replacement. She continues to have some pain in the area of lobectomy. She's brought pictures of her legs being swollen apparently this gets worse towards the end of the day when she is on her feet. She was given Lasix by her primary care which has helped relieve some of the edema but not completely. She denies any chest pain or palpitations. She denies any new lumps.     As per HPI. Otherwise, a complete review of systems is negatve.  PAST MEDICAL HISTORY: Past Medical History:  Diagnosis Date  . Anxiety   . Arthritis   . Cancer ([place]) 08/30/13   Lung, tx removal of upper left lobe and chemotherapy  . Chronic pain    pt states from lung surgery and knee pain  . GERD (gastroesophageal reflux disease)   . Hypertension   . Insomnia   . Skin cancer     PAST SURGICAL HISTORY: Past Surgical History:  Procedure Laterality Date  . ABDOMINAL HYSTERECTOMY    . CESAREAN SECTION    . CHOLECYSTECTOMY  01-02-14  . COLONOSCOPY  05-13-14   Dr Bary Castilla  . left shoulder blade Left 30 years ago   removed extra bone  . NASAL TURBINATE REDUCTION Bilateral 01/08/2016   Procedure: TURBINATE REDUCTION/SUBMUCOSAL RESECTION;   Surgeon: [name], MD;  Location: [facility];  Service: ENT;  Laterality: Bilateral;  . SEPTOPLASTY Bilateral 01/08/2016   Procedure: SEPTOPLASTY;  Surgeon: [name], MD;  Location: [facility];  Service: ENT;  Laterality: Bilateral;  . upper lobectomy Left     FAMILY HISTORY Family History  Problem Relation Age of Onset  . Thyroid cancer Mother   . Heart attack Father   . Thyroid cancer Brother          ADVANCED DIRECTIVES:  Does not have living will  HEALTH MAINTENANCE: Social History  Substance Use Topics  . Smoking status: Current Some Day Smoker    Packs/day: 0.25    Years: 35.00    Types: Cigarettes  . Smokeless tobacco: Never Used  . Alcohol use 0.0 oz/week     Comment: rare      Allergies  Allergen Reactions  . Seroquel [Quetiapine Fumarate] Other (See Comments)    hallunications    Current Outpatient Prescriptions  Medication Sig Dispense Refill  . albuterol (VENTOLIN HFA) 108 (90 Base) MCG/ACT inhaler TAKE TWO PUFFS EVERY 4 TO 6 HOURS AS NEEDED FOR SHORTNESS OF BREATH. 18 g 2  . estradiol (ESTRACE) 1 MG tablet Take 1 tablet (1 mg total) by mouth daily. 30 tablet 12  . loperamide (IMODIUM) 2 MG capsule Take by mouth daily.    Marland Kitchen omeprazole (PRILOSEC) 40 MG capsule Take 1 capsule (40 mg total) by mouth daily.  30 capsule 12  . oxyCODONE (ROXICODONE) 15 MG immediate release tablet Take 1 tablet (15 mg total) by mouth every 6 (six) hours as needed. 60 tablet 0  . rizatriptan (MAXALT) 10 MG tablet Take 1 tablet (10 mg total) by mouth as needed for migraine. May repeat in 2 hours if needed 10 tablet 12  . sulfamethoxazole-trimethoprim (BACTRIM DS,SEPTRA DS) 800-160 MG tablet Take 1 tablet by mouth 2 (two) times daily. 30 tablet 0  . fluconazole (DIFLUCAN) 150 MG tablet   0  . furosemide (LASIX) 40 MG tablet   0  . ibuprofen (ADVIL,MOTRIN) 800 MG tablet   0  . meloxicam (MOBIC) 15 MG tablet   1  . oxyCODONE-acetaminophen  (PERCOCET/ROXICET) 5-325 MG tablet   0  . potassium chloride SA (K-DUR,KLOR-CON) 20 MEQ tablet Take 1 tablet (20 mEq total) by mouth 2 (two) times daily. 60 tablet 0  . traMADol (ULTRAM) 50 MG tablet   0   No current facility-administered medications for this visit.     OBJECTIVE:  Vitals:   04/05/16 1006  BP: 137/84  Pulse: (!) 106  Temp: (!) 96.4 F (35.8 C)     Body mass index is 40.36 kg/m.    ECOG FS:1 - Symptomatic but completely ambulatory  PHYSICAL EXAM:  GENERAL:  Generalized edema over the trunk, abdomen and lower extremities. Upper extre ities and face shwo no edema. MENTAL STATUS:  Alert and oriented to person, place and time. HEAD: Normocephalic, atraumatic, face symmetric, no Cushingoid features. EYES: .  Pupils equal round and reactive to light and accomodation.  No conjunctivitis or scleral icterus. ENT:  Oropharynx clear without lesion.  Tongue normal. Mucous membranes moist.  RESPIRATORY:  Air entry is good, with scattered rales  CARDIOVASCULAR:  Regular rate and rhythm  ABDOMEN:  Soft, non-tender, with active bowel sounds, and no hepatosplenomegaly.  No masses. BACK:  No CVA tenderness.  No tenderness on percussion of the back or rib cage. SKIN:  No rashes, ulcers or lesions. EXTREMITIES: bilateral LE edema up to the inguinal rea, both knee joints are swollen, with limited ROM,  LYMPH NODES: No palpable cervical, supraclavicular, axillary or inguinal adenopathy  NEUROLOGICAL: Unremarkable.  LAB RESULTS:  Clinical Support on 04/05/2016  Component Date Value Ref Range Status  . WBC 04/05/2016 11.0  3.6 - 11.0 K/uL Final  . RBC 04/05/2016 4.50  3.80 - 5.20 MIL/uL Final  . Hemoglobin 04/05/2016 13.4  12.0 - 16.0 g/dL Final  . HCT 04/05/2016 38.8  35.0 - 47.0 % Final  . MCV 04/05/2016 86.3  80.0 - 100.0 fL Final  . MCH 04/05/2016 29.7  26.0 - 34.0 pg Final  . MCHC 04/05/2016 34.4  32.0 - 36.0 g/dL Final  . RDW 04/05/2016 13.3  11.5 - 14.5 % Final  .  Platelets 04/05/2016 272  150 - 440 K/uL Final  . Neutrophils Relative % 04/05/2016 62  % Final  . Neutro Abs 04/05/2016 6.8* 1.4 - 6.5 K/uL Final  . Lymphocytes Relative 04/05/2016 29  % Final  . Lymphs Abs 04/05/2016 3.1  1.0 - 3.6 K/uL Final  . Monocytes Relative 04/05/2016 7  % Final  . Monocytes Absolute 04/05/2016 0.8  0.2 - 0.9 K/uL Final  . Eosinophils Relative 04/05/2016 1  % Final  . Eosinophils Absolute 04/05/2016 0.1  0 - 0.7 K/uL Final  . Basophils Relative 04/05/2016 1  % Final  . Basophils Absolute 04/05/2016 0.1  0 - 0.1 K/uL Final  . Sodium 04/05/2016  139  135 - 145 mmol/L Final  . Potassium 04/05/2016 3.1* 3.5 - 5.1 mmol/L Final  . Chloride 04/05/2016 96* 101 - 111 mmol/L Final  . CO2 04/05/2016 31  22 - 32 mmol/L Final  . Glucose, Bld 04/05/2016 119* 65 - 99 mg/dL Final  . BUN 04/05/2016 11  6 - 20 mg/dL Final  . Creatinine, Ser 04/05/2016 0.67  0.44 - 1.00 mg/dL Final  . Calcium 04/05/2016 8.9  8.9 - 10.3 mg/dL Final  . Total Protein 04/05/2016 7.3  6.5 - 8.1 g/dL Final  . Albumin 04/05/2016 4.0  3.5 - 5.0 g/dL Final  . AST 04/05/2016 23  15 - 41 U/L Final  . ALT 04/05/2016 21  14 - 54 U/L Final  . Alkaline Phosphatase 04/05/2016 73  38 - 126 U/L Final  . Total Bilirubin 04/05/2016 0.3  0.3 - 1.2 mg/dL Final  . GFR calc non Af Amer 04/05/2016 >60  >60 mL/min Final  . GFR calc Af Amer 04/05/2016 >60  >60 mL/min Final   Comment: (NOTE) The eGFR has been calculated using the CKD EPI equation. This calculation has not been validated in all clinical situations. eGFR's persistently <60 mL/min signify possible Chronic Kidney Disease.   . Anion gap 04/05/2016 12  5 - 15 Final      STUDIES: Ct Chest W Contrast  Result Date: 03/25/2016 CLINICAL DATA:  Status post left upper lobectomy 08/30/2013 for lung carcinoma, status post chemotherapy, presenting for restaging. EXAM: CT CHEST WITH CONTRAST TECHNIQUE: Multidetector CT imaging of the chest was performed during  intravenous contrast administration. CONTRAST:  41m ISOVUE-300 IOPAMIDOL (ISOVUE-300) INJECTION 61% COMPARISON:  04/30/2015 chest CT. FINDINGS: Cardiovascular: Top-normal heart size. No significant pericardial fluid/thickening. Great vessels are normal in course and caliber. No central pulmonary emboli. Mediastinum/Nodes: No discrete thyroid nodules. Unremarkable esophagus. No axillary adenopathy. Mildly enlarged 1.0 cm right lower paratracheal node (series 2/ image 43), unchanged back to 07/13/2013 and non FDG avid on 02/05/2014 PET-CT, most consistent with a benign node. No new pathologically enlarged mediastinal or hilar lymph nodes. Lungs/Pleura: No pneumothorax. No pleural effusion. Status post left upper lobectomy. Posterior right upper lobe 3 mm solid pulmonary nodule (series 3/ image 26) is stable back to 07/13/2013, considered benign. Additional previously described 3 mm anterior apical right upper lobe pulmonary nodule is not discretely visualized on this scan. Posterior right lower lobe subsolid 7 mm pulmonary nodule (series 3/ image 72) is stable back to 07/13/2013. Stable parenchymal band in the anterior peripheral basilar left lower lobe consistent with scarring. New mild patchy ground-glass opacity in the anterior left lower lobe. No acute consolidative airspace disease or new significant pulmonary nodules. Cholecystectomy. Partially visualized simple appearing 1.3 cm upper left renal cyst. Upper abdomen: Unremarkable. Musculoskeletal: No aggressive appearing focal osseous lesions. Minimal thoracic spondylosis. IMPRESSION: 1. Status post left upper lobectomy. New mild patchy ground-glass opacities in the anterior left lower lobe, nonspecific, probably inflammatory. Recommend attention on follow-up chest CT in 3-6 months. 2. Greater than 2 year stability of subsolid 7 mm posterior right lower lobe pulmonary nodule, probably benign. Annual CT is recommended until 5 years of stability has been  established. This recommendation follows the consensus statement: Guidelines for Management of Incidental Pulmonary Nodules Detected on CT Images: From the Fleischner Society 2017; Radiology 2017; 284:228-243. 3. Greater than 2 year stability of mildly enlarged right lower paratracheal lymph node, non FDG avid on 02/05/2014 PET-CT, most consistent with a benign node. Otherwise no thoracic adenopathy. Electronically Signed  By: Ilona Sorrel M.D.   On: 03/25/2016 12:37   US Venous Img Lower Bilateral  Result Date: 04/05/2016 CLINICAL DATA:  Bilateral lower extremity edema. History of lung cancer. EXAM: BILATERAL LOWER EXTREMITY VENOUS DOPPLER ULTRASOUND TECHNIQUE: Gray-scale sonography with graded compression, as well as color Doppler and duplex ultrasound were performed to evaluate the lower extremity deep venous systems from the level of the common femoral vein and including the common femoral, femoral, profunda femoral, popliteal and calf veins including the posterior tibial, peroneal and gastrocnemius veins when visible. The superficial great saphenous vein was also interrogated. Spectral Doppler was utilized to evaluate flow at rest and with distal augmentation maneuvers in the common femoral, femoral and popliteal veins. COMPARISON:  None. FINDINGS: RIGHT LOWER EXTREMITY Common Femoral Vein: No evidence of thrombus. Normal compressibility, respiratory phasicity and response to augmentation. Saphenofemoral Junction: No evidence of thrombus. Normal compressibility and flow on color Doppler imaging. Profunda Femoral Vein: No evidence of thrombus. Normal compressibility and flow on color Doppler imaging. Femoral Vein: No evidence of thrombus. Normal compressibility, respiratory phasicity and response to augmentation. Popliteal Vein: No evidence of thrombus. Normal compressibility, respiratory phasicity and response to augmentation. Calf Veins: Visualized right deep calf veins are patent without thrombus. LEFT  LOWER EXTREMITY Common Femoral Vein: No evidence of thrombus. Normal compressibility, respiratory phasicity and response to augmentation. Saphenofemoral Junction: No evidence of thrombus. Normal compressibility and flow on color Doppler imaging. Profunda Femoral Vein: No evidence of thrombus. Normal compressibility and flow on color Doppler imaging. Femoral Vein: No evidence of thrombus. Normal compressibility, respiratory phasicity and response to augmentation. Popliteal Vein: No evidence of thrombus. Normal compressibility, respiratory phasicity and response to augmentation. Calf Veins: Visualized left deep calf veins are patent without thrombus. IMPRESSION: No evidence of deep venous thrombosis. Electronically Signed   By: Markus Daft M.D.   On: 04/05/2016 12:19    ASSESSMENT:    Lung cancer status post lobectomy. CT chest dated 03/25/2016 has been reviewed with her. This shows stable mildly enlarged right lower paratracheal node posterior right upper lobe 3 mm solid pulmonary nodule and a 7 mm saw subs solid pulmonary nodule in the posterior right lower lobe. All of the bowel habit compared to the CT scan from February 2015 and are reported to be stable. No pathologically enlarged lymph nodes or masses were noted. However there was a new mild patchy groundglass opacity noted in the anterior left lower lobe without consolidation.  This area needs further follow-up given that it is likely to be inflammatory a repeat CT chest has been requested for 3 months.  With regard to the pulmonary nodules have been stable now for 2 years based on the CT scans and stability need to be established for 3 more years.   With regard to bilateral leg edema and generalized anasarca, she does not appear to have hypoproteinemia serum creatinine is normal the etiology is likely to be cardiac in origin I have arranged for a referral  to cardiology. However I do like to rule out a DVT involving both extremities. In  addition I will obtain a CT of the abdomen pelvis to rule out any intra-abdominal or pelvic mass or lymphadenopathy that could be causing mechanical obstruction to the venous circulation. In previous CAT scans she was noted to have an left adnexal cyst. She is status post hysterectomy and unilateral oophorectomy.   She's been taking Lasix prescribed by her primary a potassium today is borderline at 3.1. I've asked her to  take potassium 40 mg per day for as long as she is on Lasix. She'll return for follow-up next week to review results of the scans. Addendum Doppler results having called in and reported negative for DVT in either legs. I've also noted that she is had a mammogram back in 2016 audited by oncologist Dr. Oliva Bustard. She is overdue for mammogram screening bilateral mammogram has been requested.  She is contemplating bilateral knee replacement for painful arthritics that has limited her activity.

## 2016-04-07 ENCOUNTER — Ambulatory Visit
Admission: RE | Admit: 2016-04-07 | Discharge: 2016-04-07 | Disposition: A | Discharge status: Home / Self Care | Payer: BLUE CROSS/BLUE SHIELD | Source: Ambulatory Visit | Attending: Internal Medicine | Admitting: Internal Medicine

## 2016-04-07 DIAGNOSIS — R600 Localized edema: Secondary | ICD-10-CM

## 2016-04-07 DIAGNOSIS — N281 Cyst of kidney, acquired: Secondary | ICD-10-CM | POA: Diagnosis not present

## 2016-04-07 DIAGNOSIS — K5730 Diverticulosis of large intestine without perforation or abscess without bleeding: Secondary | ICD-10-CM | POA: Insufficient documentation

## 2016-04-07 DIAGNOSIS — D1771 Benign lipomatous neoplasm of kidney: Secondary | ICD-10-CM | POA: Insufficient documentation

## 2016-04-07 DIAGNOSIS — Z9049 Acquired absence of other specified parts of digestive tract: Secondary | ICD-10-CM | POA: Insufficient documentation

## 2016-04-07 DIAGNOSIS — N83202 Unspecified ovarian cyst, left side: Secondary | ICD-10-CM | POA: Diagnosis not present

## 2016-04-07 DIAGNOSIS — C3400 Malignant neoplasm of unspecified main bronchus: Secondary | ICD-10-CM

## 2016-04-07 MED ORDER — IOPAMIDOL (ISOVUE-300) INJECTION 61%
150.0000 mL | Freq: Once | INTRAVENOUS | Status: AC | PRN
  Administered 2016-04-07: 100 mL via INTRAVENOUS

## 2016-04-11 ENCOUNTER — Telehealth: Payer: Self-pay | Admitting: *Deleted

## 2016-04-11 ENCOUNTER — Ambulatory Visit: Payer: BLUE CROSS/BLUE SHIELD | Admitting: Cardiology

## 2016-04-11 ENCOUNTER — Ambulatory Visit (INDEPENDENT_AMBULATORY_CARE_PROVIDER_SITE_OTHER): Payer: BLUE CROSS/BLUE SHIELD | Admitting: Cardiology

## 2016-04-11 ENCOUNTER — Encounter: Payer: Self-pay | Admitting: Cardiology

## 2016-04-11 VITALS — BP 156/90 | HR 78 | Ht 62.0 in | Wt 222.0 lb

## 2016-04-11 DIAGNOSIS — M7989 Other specified soft tissue disorders: Secondary | ICD-10-CM

## 2016-04-11 DIAGNOSIS — R0602 Shortness of breath: Secondary | ICD-10-CM | POA: Diagnosis not present

## 2016-04-11 DIAGNOSIS — I10 Essential (primary) hypertension: Secondary | ICD-10-CM | POA: Diagnosis not present

## 2016-04-11 MED ORDER — FUROSEMIDE 20 MG PO TABS: 40.0000 mg | ORAL_TABLET | Freq: Two times a day (BID) | ORAL | 0 refills | Status: DC

## 2016-04-11 MED ORDER — LEVOFLOXACIN 500 MG PO TABS: 500.0000 mg | ORAL_TABLET | Freq: Every day | ORAL | 0 refills | Status: DC

## 2016-04-11 NOTE — Telephone Encounter (Signed)
Called to report that she was told by Dr Bangladesh when seen on the 14th that she had pneumonia per XR, but she did not feel sick then. She is now running fever, SOB, Coughing up green sputum, wheezing, has rattling in her cheat and feels like her lungs are on fire. She has an appt tomorrow with Dr Bangladesh, but feels she needs something started today. Please advise

## 2016-04-11 NOTE — Telephone Encounter (Signed)
I do not see an CXR in her results tab.  But with fevers, call in Levaquin 500 mg daily x 7 days.

## 2016-04-11 NOTE — Patient Instructions (Addendum)
Medication Instructions:  Your physician has recommended you make the following change in your medication:  1. Lasix (Furosemide) 40 mg Twice daily 2. Make sure to take your potassium  Labwork: Your physician recommends that you return for lab work in: 1 week for BMP, BNP, TSH, T4   Testing/Procedures: Your physician has requested that you have an echocardiogram. Echocardiography is a painless test that uses sound waves to create images of your heart. It provides your doctor with information about the size and shape of your heart and how well your heart's chambers and valves are working. This procedure takes approximately one hour. There are no restrictions for this procedure.   Your physician has requested that you regularly monitor and record your blood pressure readings at home. Please use the same machine at the same time of day to check your readings and record them to bring to your follow-up visit.   Follow-Up: Your physician recommends that you schedule a follow-up appointment in: 2 weeks with Dr. Yvone Neu.  It was a pleasure seeing you today here in the office. Please do not hesitate to give [place] a call back if you have any further questions. [place], BSN      Echocardiogram An echocardiogram, or echocardiography, uses sound waves (ultrasound) to produce an image of your heart. The echocardiogram is simple, painless, obtained within a short period of time, and offers valuable information to your health care provider. The images from an echocardiogram can provide information such as:  Evidence of coronary artery disease (CAD).  Heart size.  Heart muscle function.  Heart valve function.  Aneurysm detection.  Evidence of a past heart attack.  Fluid buildup around the heart.  Heart muscle thickening.  Assess heart valve function. Tell a health care provider about:  Any allergies you have.  All medicines you are taking, including vitamins, herbs,  eye drops, creams, and over-the-counter medicines.  Any problems you or family members have had with anesthetic medicines.  Any blood disorders you have.  Any surgeries you have had.  Any medical conditions you have.  Whether you are pregnant or may be pregnant. What happens before the procedure? No special preparation is needed. Eat and drink normally. What happens during the procedure?  In order to produce an image of your heart, gel will be applied to your chest and a wand-like tool (transducer) will be moved over your chest. The gel will help transmit the sound waves from the transducer. The sound waves will harmlessly bounce off your heart to allow the heart images to be captured in real-time motion. These images will then be recorded.  You may need an IV to receive a medicine that improves the quality of the pictures. What happens after the procedure? You may return to your normal schedule including diet, activities, and medicines, unless your health care provider tells you otherwise. This information is not intended to replace advice given to you by your health care provider. Make sure you discuss any questions you have with your health care provider. Document Released: 05/06/2000 Document Revised: 12/26/2015 Document Reviewed: 01/14/2013 Elsevier Interactive Patient Education  2017 [employer].

## 2016-04-11 NOTE — Addendum Note (Signed)
Addended by: Valora Corporal on: 04/11/2016 02:47 PM   Modules accepted: Orders

## 2016-04-11 NOTE — Progress Notes (Signed)
Cardiology Office Note   Date:  04/11/2016   ID:  Natalie Stark, DOB 1960-10-27, MRN [id number]  Referring Doctor:  Pcp Not In System Dr. Sherrine Maples, Dr. Tamala Julian  Cardiologist:   Wende Bushy, MD   Reason for consultation:  Chief Complaint  Patient presents with  . other    New Patient. Referred by Dr. Joaquin Music for edema in extremities on 04/05/16. Pt states she was told she has pnemonia. Reviewed meds with pt verbally.      History of Present Illness: Natalie Stark is a 55 y.o. female who presents for Shortness of breath and leg swelling  History of adenocarcinoma of the left lung, status post upper lobectomy April 2015, adjuvant chemotherapy in the form of cisplatin and Alimta. stage IIa T3 N0 M0 with a visceral pleural involvement and 2 nodules in the same lobe of the lung.  Patient recalls that her leg swelling has been going on for the last 2 years. Gotten worse in the last 2 months and especially in the last 2 weeks. She has noted about 60 pounds of weight gain over the last 2 years. The swelling can go up to just above the knee. She was prescribed Lasix by her PCP. Sometimes, she needs to take it twice a day for it to work. She was also recently prescribed potassium supplementation by the oncology doctor.  In terms of shortness of breath, she felt very you well and had no shortness of breath right after the lobectomy. Since then, she has noticed progressive worsening especially in the last month or so. She can only walk a few feet before she gets short of breath. She denies orthopnea and PND. She denies chest pain. Shortness of breath is described as moderate, felt in the center of the chest. Last a few minutes or so with exertion, goes away with rest.  In terms of hypertension, she used to be an benazepril 40 mg once a day for she has been off of it for the last 1.5 years. Her blood pressure has been overall okay with maximal in the 140s.  Patient denies  palpitations, loss of consciousness, abdominal pain.   ROS:  Please see the history of present illness. Aside from mentioned under HPI, all other systems are reviewed and negative.     Past Medical History:  Diagnosis Date  . Anxiety   . Arthritis   . Cancer ([place]) 08/30/13   Lung, tx removal of upper left lobe and chemotherapy  . Chronic pain    pt states from lung surgery and knee pain  . GERD (gastroesophageal reflux disease)   . Hypertension   . Insomnia   . 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 Skin cancer     Past Surgical History:  Procedure Laterality Date  . ABDOMINAL HYSTERECTOMY    . CESAREAN SECTION    . CHOLECYSTECTOMY  01-02-14  . COLONOSCOPY  05-13-14   Dr Bary Castilla  . left shoulder blade Left 30 years ago   removed extra bone  . NASAL TURBINATE REDUCTION Bilateral 01/08/2016   Procedure: TURBINATE REDUCTION/SUBMUCOSAL RESECTION;  Surgeon: [name], MD;  Location: [facility];  Service: ENT;  Laterality: Bilateral;  . SEPTOPLASTY Bilateral 01/08/2016   Procedure: SEPTOPLASTY;  Surgeon: [name], MD;  Location: [facility];  Service: ENT;  Laterality: Bilateral;  . upper lobectomy Left      reports that she has been smoking  Cigarettes.  She has a 17.50 pack-year smoking history. She has never used smokeless tobacco. She reports that she drinks alcohol. She reports that she does not use drugs.   family history includes Heart attack in her father; Thyroid cancer in her brother and mother.   Outpatient Medications Prior to Visit  Medication Sig Dispense Refill  . albuterol (VENTOLIN HFA) 108 (90 Base) MCG/ACT inhaler TAKE TWO PUFFS EVERY 4 TO 6 HOURS AS NEEDED FOR SHORTNESS OF BREATH. 18 g 2  . estradiol (ESTRACE) 1 MG tablet Take 1 tablet (1 mg total) by mouth daily. 30 tablet 12  . fluconazole (DIFLUCAN) 150 MG tablet as needed.   0  . furosemide (LASIX) 40 MG tablet  as needed.   0  . ibuprofen (ADVIL,MOTRIN) 800 MG tablet as needed.   0  . loperamide (IMODIUM) 2 MG capsule Take by mouth as needed.     Marland Kitchen omeprazole (PRILOSEC) 40 MG capsule Take 1 capsule (40 mg total) by mouth daily. 30 capsule 12  . rizatriptan (MAXALT) 10 MG tablet Take 1 tablet (10 mg total) by mouth as needed for migraine. May repeat in 2 hours if needed 10 tablet 12  . traMADol (ULTRAM) 50 MG tablet as needed.   0  . meloxicam (MOBIC) 15 MG tablet   1  . oxyCODONE (ROXICODONE) 15 MG immediate release tablet Take 1 tablet (15 mg total) by mouth every 6 (six) hours as needed. (Patient not taking: Reported on 04/11/2016) 60 tablet 0  . oxyCODONE-acetaminophen (PERCOCET/ROXICET) 5-325 MG tablet   0  . potassium chloride SA (K-DUR,KLOR-CON) 20 MEQ tablet Take 1 tablet (20 mEq total) by mouth 2 (two) times daily. (Patient not taking: Reported on 04/11/2016) 60 tablet 0  . sulfamethoxazole-trimethoprim (BACTRIM DS,SEPTRA DS) 800-160 MG tablet Take 1 tablet by mouth 2 (two) times daily. (Patient not taking: Reported on 04/11/2016) 30 tablet 0   No facility-administered medications prior to visit.      Allergies: Seroquel [quetiapine fumarate]    PHYSICAL EXAM: VS:  BP (!) 156/90 (BP Location: Right Arm, Patient Position: Sitting, Cuff Size: Normal)   Pulse 78   Ht '5\' 2"'$  (1.575 m)   Wt 222 lb (100.7 kg)   BMI 40.60 kg/m  , Body mass index is 40.6 kg/m. Wt Readings from Last 3 Encounters:  04/11/16 222 lb (100.7 kg)  04/05/16 220 lb 10.9 oz (100.1 kg)  01/08/16 218 lb (98.9 kg)    GENERAL:  well developed, well nourished, obese, not in acute distress HEENT: normocephalic, pink conjunctivae, anicteric sclerae, no xanthelasma, normal dentition, oropharynx clear NECK:  There appears to be neck vein engorgement with hepatojugular reflux, carotid upstroke brisk and symmetric, no bruit, no thyromegaly, no lymphadenopathy LUNGS:  good respiratory effort, clear to auscultation  bilaterally CV:  PMI not displaced, no thrills, no lifts, S1 and S2 within normal limits, no palpable S3 or S4, no murmurs, no rubs, no gallops ABD:  Soft, nontender, nondistended, normoactive bowel sounds, no abdominal aortic bruit, no hepatomegaly, no splenomegaly MS: nontender back, no kyphosis, no scoliosis, no joint deformities EXT:  2+ DP/PT pulses, +1 edema, no varicosities, no cyanosis, no clubbing SKIN: warm, nondiaphoretic, normal turgor, no ulcers NEUROPSYCH: alert, oriented to person, place, and time, sensory/motor grossly intact, normal mood, appropriate affect  Recent Labs: 04/05/2016: ALT 21; BUN 11; Creatinine, Ser 0.67; Hemoglobin 13.4; Platelets 272; Potassium 3.1; Sodium 139   Lipid Panel    Component Value Date/Time   CHOL 245 (H) 12/09/2014  1559   TRIG 177 (H) 12/09/2014 1559   HDL 63 12/09/2014 1559   CHOLHDL 3.9 12/09/2014 1559   LDLCALC 147 (H) 12/09/2014 1559     Other studies Reviewed:  EKG:  The ekg from 04/11/2016 was personally reviewed by me and it revealed sinus rhythm, 70 BPM.  Additional studies/ records that were reviewed personally reviewed by me today include: None available   ASSESSMENT AND PLAN:  Shortness of breath Leg swelling Possible congestive heart failure Recommend echocardiogram. Recommend trial of Lasix 40 mg twice a day, with a potassium supplementation. Repeat BMP in 1 week. We will do a BNP and TSH and free T4 at that time as well. Follow-up in the office in 2-3 weeks. Depending on her test results, she may need further evaluation with a stress test. Risk factors for CAD include smoking history, history of hypertension, so family history of CAD in the father who died from heart attack at age 9. He presented earlier on with heart problems.  History of hypertension Continue monitoring BP. Continue current medical therapy and lifestyle changes.   Current medicines are reviewed at length with the patient today.  The patient  does not have concerns regarding medicines.  Labs/ tests ordered today include:  Orders Placed This Encounter  Procedures  . TSH  . T4, free  . Basic metabolic panel  . B Nat Peptide  . EKG 12-Lead  . ECHOCARDIOGRAM COMPLETE    I had a lengthy and detailed discussion with the patient regarding diagnoses, prognosis, diagnostic options, treatment options , and side effects of medications.   I counseled the patient on importance of lifestyle modification including heart healthy diet, regular physical activity  , and smoking cessation.   Disposition:   FU with undersigned after tests   I spent at least 40 minutes with the patient today and more than 50% of the time was spent counseling the patient and coordinating care.     Signed, Wende Bushy, MD  04/11/2016 10:32 AM    [place]  This note was generated in part with voice recognition software and I apologize for any typographical errors that were not detected and corrected.

## 2016-04-11 NOTE — Telephone Encounter (Signed)
Patient confirmed [place] and informed of Rx being sent

## 2016-04-12 ENCOUNTER — Inpatient Hospital Stay (HOSPITAL_BASED_OUTPATIENT_CLINIC_OR_DEPARTMENT_OTHER): Payer: BLUE CROSS/BLUE SHIELD | Admitting: Internal Medicine

## 2016-04-12 ENCOUNTER — Inpatient Hospital Stay (HOSPITAL_BASED_OUTPATIENT_CLINIC_OR_DEPARTMENT_OTHER): Payer: BLUE CROSS/BLUE SHIELD | Admitting: Hematology and Oncology

## 2016-04-12 VITALS — BP 150/76 | HR 96 | Temp 97.7°F | Ht 62.0 in | Wt 212.6 lb

## 2016-04-12 DIAGNOSIS — M170 Bilateral primary osteoarthritis of knee: Secondary | ICD-10-CM

## 2016-04-12 DIAGNOSIS — R05 Cough: Secondary | ICD-10-CM

## 2016-04-12 DIAGNOSIS — Z85828 Personal history of other malignant neoplasm of skin: Secondary | ICD-10-CM

## 2016-04-12 DIAGNOSIS — G8928 Other chronic postprocedural pain: Secondary | ICD-10-CM

## 2016-04-12 DIAGNOSIS — I10 Essential (primary) hypertension: Secondary | ICD-10-CM

## 2016-04-12 DIAGNOSIS — E876 Hypokalemia: Secondary | ICD-10-CM

## 2016-04-12 DIAGNOSIS — Z9221 Personal history of antineoplastic chemotherapy: Secondary | ICD-10-CM

## 2016-04-12 DIAGNOSIS — C3492 Malignant neoplasm of unspecified part of left bronchus or lung: Secondary | ICD-10-CM

## 2016-04-12 DIAGNOSIS — K219 Gastro-esophageal reflux disease without esophagitis: Secondary | ICD-10-CM

## 2016-04-12 DIAGNOSIS — M25561 Pain in right knee: Secondary | ICD-10-CM

## 2016-04-12 DIAGNOSIS — Z792 Long term (current) use of antibiotics: Secondary | ICD-10-CM

## 2016-04-12 DIAGNOSIS — Z902 Acquired absence of lung [part of]: Secondary | ICD-10-CM | POA: Diagnosis not present

## 2016-04-12 DIAGNOSIS — C3400 Malignant neoplasm of unspecified main bronchus: Secondary | ICD-10-CM

## 2016-04-12 DIAGNOSIS — R093 Abnormal sputum: Secondary | ICD-10-CM

## 2016-04-12 DIAGNOSIS — F17210 Nicotine dependence, cigarettes, uncomplicated: Secondary | ICD-10-CM

## 2016-04-12 DIAGNOSIS — N83202 Unspecified ovarian cyst, left side: Secondary | ICD-10-CM

## 2016-04-12 DIAGNOSIS — C3412 Malignant neoplasm of upper lobe, left bronchus or lung: Secondary | ICD-10-CM

## 2016-04-12 DIAGNOSIS — G8929 Other chronic pain: Secondary | ICD-10-CM

## 2016-04-12 DIAGNOSIS — R918 Other nonspecific abnormal finding of lung field: Secondary | ICD-10-CM

## 2016-04-12 DIAGNOSIS — R600 Localized edema: Secondary | ICD-10-CM

## 2016-04-12 DIAGNOSIS — J159 Unspecified bacterial pneumonia: Secondary | ICD-10-CM

## 2016-04-12 DIAGNOSIS — Z79899 Other long term (current) drug therapy: Secondary | ICD-10-CM

## 2016-04-12 LAB — COMPREHENSIVE METABOLIC PANEL
ALBUMIN: 3.8 g/dL (ref 3.5–5.0)
ALT: 19 U/L (ref 14–54)
AST: 26 U/L (ref 15–41)
Alkaline Phosphatase: 80 U/L (ref 38–126)
Anion gap: 8 (ref 5–15)
BILIRUBIN TOTAL: 0.4 mg/dL (ref 0.3–1.2)
BUN: 9 mg/dL (ref 6–20)
CHLORIDE: 103 mmol/L (ref 101–111)
CO2: 29 mmol/L (ref 22–32)
CREATININE: 0.64 mg/dL (ref 0.44–1.00)
Calcium: 8.5 mg/dL — ABNORMAL LOW (ref 8.9–10.3)
GFR calc Af Amer: >60 mL/min (ref >60)
GLUCOSE: 167 mg/dL — AB (ref 65–99)
Potassium: 3.3 mmol/L — ABNORMAL LOW (ref 3.5–5.1)
Sodium: 140 mmol/L (ref 135–145)
Total Protein: 6.9 g/dL (ref 6.5–8.1)

## 2016-04-12 LAB — CBC WITH DIFFERENTIAL/PLATELET
BASOS ABS: 0.1 10*3/uL (ref 0–0.1)
Basophils Relative: 1 %
Eosinophils Absolute: 0.1 10*3/uL (ref 0–0.7)
Eosinophils Relative: 2 %
HEMATOCRIT: 37.4 % (ref 35.0–47.0)
Hemoglobin: 12.7 g/dL (ref 12.0–16.0)
LYMPHS PCT: 28 %
Lymphs Abs: 2.1 10*3/uL (ref 1.0–3.6)
MCH: 29.7 pg (ref 26.0–34.0)
MCHC: 33.9 g/dL (ref 32.0–36.0)
MCV: 87.7 fL (ref 80.0–100.0)
Monocytes Absolute: 0.6 10*3/uL (ref 0.2–0.9)
Monocytes Relative: 8 %
NEUTROS ABS: 4.7 10*3/uL (ref 1.4–6.5)
Neutrophils Relative %: 61 %
PLATELETS: 249 10*3/uL (ref 150–440)
RBC: 4.26 MIL/uL (ref 3.80–5.20)
RDW: 13.4 % (ref 11.5–14.5)
WBC: 7.7 10*3/uL (ref 3.6–11.0)

## 2016-04-12 MED ORDER — LEVOFLOXACIN 250 MG PO TABS: 250.0000 mg | ORAL_TABLET | Freq: Every day | ORAL | 0 refills | Status: DC

## 2016-04-12 NOTE — Telephone Encounter (Signed)
Please let her know that I called over an addiitional 250 mg Levaquin to add to 500 mg po once daily, to make it to 750 mg daily

## 2016-04-12 NOTE — Telephone Encounter (Signed)
Patient informed of new rx at pharmacy and I explained to her to take both a 500 mg and 250 mg tab daily for a total dose of 750 mg

## 2016-04-12 NOTE — Progress Notes (Signed)
Patient here for follow up. She is complaining of chest/lung pain. Stated she was "running a fever yesterday 99.5. Productive cough yellow thick sputum.

## 2016-04-18 ENCOUNTER — Ambulatory Visit
Admission: RE | Admit: 2016-04-18 | Discharge: 2016-04-18 | Disposition: A | Discharge status: Home / Self Care | Payer: BLUE CROSS/BLUE SHIELD | Source: Ambulatory Visit | Attending: Hematology and Oncology | Admitting: Hematology and Oncology

## 2016-04-18 ENCOUNTER — Telehealth: Payer: Self-pay | Admitting: *Deleted

## 2016-04-18 ENCOUNTER — Inpatient Hospital Stay (HOSPITAL_BASED_OUTPATIENT_CLINIC_OR_DEPARTMENT_OTHER): Payer: BLUE CROSS/BLUE SHIELD | Admitting: Hematology and Oncology

## 2016-04-18 ENCOUNTER — Other Ambulatory Visit: Payer: BLUE CROSS/BLUE SHIELD

## 2016-04-18 VITALS — BP 137/85 | HR 111 | Temp 96.8°F | Resp 18 | Wt 222.0 lb

## 2016-04-18 DIAGNOSIS — Z9221 Personal history of antineoplastic chemotherapy: Secondary | ICD-10-CM

## 2016-04-18 DIAGNOSIS — R918 Other nonspecific abnormal finding of lung field: Secondary | ICD-10-CM | POA: Diagnosis not present

## 2016-04-18 DIAGNOSIS — C3492 Malignant neoplasm of unspecified part of left bronchus or lung: Secondary | ICD-10-CM

## 2016-04-18 DIAGNOSIS — Z902 Acquired absence of lung [part of]: Secondary | ICD-10-CM

## 2016-04-18 DIAGNOSIS — M170 Bilateral primary osteoarthritis of knee: Secondary | ICD-10-CM

## 2016-04-18 DIAGNOSIS — J9811 Atelectasis: Secondary | ICD-10-CM | POA: Insufficient documentation

## 2016-04-18 DIAGNOSIS — Z79899 Other long term (current) drug therapy: Secondary | ICD-10-CM

## 2016-04-18 DIAGNOSIS — C3412 Malignant neoplasm of upper lobe, left bronchus or lung: Secondary | ICD-10-CM

## 2016-04-18 DIAGNOSIS — F17210 Nicotine dependence, cigarettes, uncomplicated: Secondary | ICD-10-CM

## 2016-04-18 DIAGNOSIS — E876 Hypokalemia: Secondary | ICD-10-CM

## 2016-04-18 DIAGNOSIS — R600 Localized edema: Secondary | ICD-10-CM

## 2016-04-18 DIAGNOSIS — R05 Cough: Secondary | ICD-10-CM

## 2016-04-18 DIAGNOSIS — I10 Essential (primary) hypertension: Secondary | ICD-10-CM

## 2016-04-18 DIAGNOSIS — R5383 Other fatigue: Secondary | ICD-10-CM

## 2016-04-18 DIAGNOSIS — R059 Cough, unspecified: Secondary | ICD-10-CM

## 2016-04-18 DIAGNOSIS — K219 Gastro-esophageal reflux disease without esophagitis: Secondary | ICD-10-CM

## 2016-04-18 DIAGNOSIS — R093 Abnormal sputum: Secondary | ICD-10-CM

## 2016-04-18 DIAGNOSIS — Z85828 Personal history of other malignant neoplasm of skin: Secondary | ICD-10-CM

## 2016-04-18 DIAGNOSIS — J40 Bronchitis, not specified as acute or chronic: Secondary | ICD-10-CM

## 2016-04-18 DIAGNOSIS — Z792 Long term (current) use of antibiotics: Secondary | ICD-10-CM

## 2016-04-18 DIAGNOSIS — N83202 Unspecified ovarian cyst, left side: Secondary | ICD-10-CM

## 2016-04-18 MED ORDER — AMOXICILLIN-POT CLAVULANATE 875-125 MG PO TABS: 1.0000 | ORAL_TABLET | Freq: Two times a day (BID) | ORAL | 0 refills | Status: DC

## 2016-04-18 NOTE — Telephone Encounter (Signed)
Informed patient that she would be receiving a call from Arabella Merles.

## 2016-04-18 NOTE — Telephone Encounter (Deleted)
Pt had called triage and wanted some more atb. I called er back and asked if she was still having fever and she has low grade 99.5, she is still coughing up green sputum. It did get better but after atb gone it came back as it was before. She also states that she still has wheezing.  I spoke to [place] and she wants her to have cxr and come see her this afternoon at 3:30 pt agreeable to the plan.

## 2016-04-18 NOTE — Progress Notes (Signed)
Patient acute add on today for cough, congestion, pneumonia.  Patient did have CXR prior to appointment.

## 2016-04-18 NOTE — Progress Notes (Signed)
[facility] day:  04/18/2016  Chief Complaint: Natalie Stark is a 55 y.o. female with stage IIA left lung cancer who is seen for a sick call visit.  HPI: The patient was diagnosed with adenocarcinoma of the lung in 2015.  She underwent left upper lobe lobectomy on 09/04/2013 by Dr. Nestor Lewandowsky.  Pathology revealed a 2.2 cm and a 1.0 mass adenocarcinoma, acinar predominant, in the left upper lobe.  There was a focally suspicious area of involvement in the visceral pleura.  18 lymph nodes were negative.  EGFR and ALK were negative.  Pathologic stage was T3N0M0.  The patient enrolled on the Master protocol under [facility].  She received adjuvant cisplatin and Alimta beginning 09/30/2013.  PET scan on 09/22/2014 revealed no evidence of recurrent disease.  Chest CT on 04/30/2015 revealed no evidence of recurrent disease with 2 tiny RUL pulmonary nodules with a stable mildy enlarged right lower paratracheal lymph node.    Chest CT on 03/29/2016 revealed new mild patchy ground-glass opacities in the anterior left lower lobe, nonspecific, probably inflammatory. Chest CT in 3-6 months was recommended.  There was greater than 2 year stability of subsolid 7 mm posterior right lower lobe pulmonary nodule, probably benign. Annual CT was recommended until 5 years of stability.  There was greater than 2 year stability of mildly enlarged right lower paratracheal lymph node, non FDG avid on 02/05/2014 PET-CT, most consistent with a benign node.  The patient was seen by Dr. Sherrine Maples on 04/05/2016.  She denied any fever or cough.  She was noted to have lower extremity edema at the end of the day.  Bilateral lower extremity duplex revealed no evidence of DVT.  Abdomen and pelvic CT scan on 04/07/2016 revealed no acute abnormality.    She states that 2 days later, she felt horrible.  She describes fever, wheezing and sputum production.  She was started on Levaquin 500  mg a day for 5 days.  Antibiotics were completed.  She took 500 mg a day for 3 days then for 2 days took 750 mg.  She received an additional prescription for 250 mg tablets.  Her last day of antibiotics was yesterday.  She describes fever to 99.5.  She denies any chills.  She has had wheezing.  She has a cough productive of green sputum.  She is fatigued.  She has no energy.   Past Medical History:  Diagnosis Date  . Anxiety   . Arthritis   . Cancer ([place]) 08/30/13   Lung, tx removal of upper left lobe and chemotherapy  . Chronic pain    pt states from lung surgery and knee pain  . GERD (gastroesophageal reflux disease)   . Hypertension   . Insomnia   . 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 Skin cancer     Past Surgical History:  Procedure Laterality Date  . ABDOMINAL HYSTERECTOMY    . CESAREAN SECTION    . CHOLECYSTECTOMY  01-02-14  . COLONOSCOPY  05-13-14   Dr Bary Castilla  . left shoulder blade Left 30 years ago   removed extra bone  . NASAL TURBINATE REDUCTION Bilateral 01/08/2016   Procedure: TURBINATE REDUCTION/SUBMUCOSAL RESECTION;  Surgeon: [name], MD;  Location: [facility];  Service: ENT;  Laterality: Bilateral;  . SEPTOPLASTY Bilateral 01/08/2016   Procedure: SEPTOPLASTY;  Surgeon: [name], MD;  Location: [facility];  Service: ENT;  Laterality: Bilateral;  . upper lobectomy Left     Family History  Problem Relation Age of Onset  . Thyroid cancer Mother   . Heart attack Father   . Thyroid cancer Brother     Social History:  reports that she has been smoking Cigarettes.  She has a 17.50 pack-year smoking history. She has never used smokeless tobacco. She reports that she drinks alcohol. She reports that she does not use drugs.  She lives in [place].  The patient is alone today.  Allergies:  Allergies  Allergen Reactions  . Seroquel [Quetiapine Fumarate] Other (See  Comments)    hallunications    Current Medications: Current Outpatient Prescriptions  Medication Sig Dispense Refill  . albuterol (VENTOLIN HFA) 108 (90 Base) MCG/ACT inhaler TAKE TWO PUFFS EVERY 4 TO 6 HOURS AS NEEDED FOR SHORTNESS OF BREATH. 18 g 2  . estradiol (ESTRACE) 1 MG tablet Take 1 tablet (1 mg total) by mouth daily. 30 tablet 12  . fluconazole (DIFLUCAN) 150 MG tablet as needed.   0  . furosemide (LASIX) 20 MG tablet Take 2 tablets (40 mg total) by mouth 2 (two) times daily. 120 tablet 0  . ibuprofen (ADVIL,MOTRIN) 800 MG tablet as needed.   0  . loperamide (IMODIUM) 2 MG capsule Take by mouth as needed.     Marland Kitchen omeprazole (PRILOSEC) 40 MG capsule Take 1 capsule (40 mg total) by mouth daily. 30 capsule 12  . potassium chloride SA (K-DUR,KLOR-CON) 20 MEQ tablet Take 40 mEq by mouth 2 (two) times daily.     . rizatriptan (MAXALT) 10 MG tablet Take 1 tablet (10 mg total) by mouth as needed for migraine. May repeat in 2 hours if needed 10 tablet 12  . traMADol (ULTRAM) 50 MG tablet as needed.   0   No current facility-administered medications for this visit.     Review of Systems:  GENERAL:  Fatigue.  Fever to 99.5.  No chills or sweats.  No weight loss. PERFORMANCE STATUS (ECOG):  1 HEENT:  No visual changes, runny nose, sore throat, mouth sores or tenderness. Lungs:  Shortness of breath on exertion.  Cough productive of green sputum.  No hemoptysis. Cardiac:  No chest pain, palpitations, orthopnea, or PND. GI:  No nausea, vomiting, diarrhea, constipation, melena or hematochezia. GU:  No urgency, frequency, dysuria, or hematuria. Musculoskeletal:  No back pain.  Knee pain.  No muscle tenderness. Extremities:  No pain or swelling. Skin:  No rashes or skin changes. Neuro:  No headache, numbness or weakness, balance or coordination issues. Endocrine:  No diabetes, thyroid issues, hot flashes or night sweats. Psych:  No mood changes, depression or anxiety. Pain:  No focal  pain. Review of systems:  All other systems reviewed and found to be negative.  Physical Exam: Blood pressure 137/85, pulse (!) 111, temperature (!) 96.8 F (36 C), temperature source Tympanic, resp. rate 18, weight 222 lb (100.7 kg). GENERAL:  Well developed, well nourished, woman sitting comfortably in the exam room in no acute distress.  She is tearful at times. MENTAL STATUS:  Alert and oriented to person, place and time. HEAD:  Long brown hair.  Normocephalic, atraumatic, face symmetric, no Cushingoid features. EYES:  Glasses.  Bleu eyes.  Pupils equal round and reactive to light and accomodation.  No conjunctivitis or scleral icterus. ENT:  Oropharynx clear without lesion.  Tongue normal. Mucous membranes moist.  RESPIRATORY:  Scattered soft wheezes.  Clear to auscultation without rales or rhonchi.  CARDIOVASCULAR:  Regular rate and rhythm without murmur, rub or gallop. ABDOMEN:  Soft, non-tender, with active bowel sounds, and no hepatosplenomegaly.  No masses. SKIN:  No rashes, ulcers or lesions. EXTREMITIES: No edema, no skin discoloration or tenderness.  No palpable cords. LYMPH NODES: No palpable cervical, supraclavicular, axillary or inguinal adenopathy  NEUROLOGICAL: Unremarkable. PSYCH:  Appropriate.  No visits with results within 3 Day(s) from this visit.  Latest known visit with results is:  Clinical Support on 04/12/2016  Component Date Value Ref Range Status  . WBC 04/12/2016 7.7  3.6 - 11.0 K/uL Final  . RBC 04/12/2016 4.26  3.80 - 5.20 MIL/uL Final  . Hemoglobin 04/12/2016 12.7  12.0 - 16.0 g/dL Final  . HCT 04/12/2016 37.4  35.0 - 47.0 % Final  . MCV 04/12/2016 87.7  80.0 - 100.0 fL Final  . MCH 04/12/2016 29.7  26.0 - 34.0 pg Final  . MCHC 04/12/2016 33.9  32.0 - 36.0 g/dL Final  . RDW 04/12/2016 13.4  11.5 - 14.5 % Final  . Platelets 04/12/2016 249  150 - 440 K/uL Final  . Neutrophils Relative % 04/12/2016 61  % Final  . Neutro Abs 04/12/2016 4.7  1.4 - 6.5 K/uL  Final  . Lymphocytes Relative 04/12/2016 28  % Final  . Lymphs Abs 04/12/2016 2.1  1.0 - 3.6 K/uL Final  . Monocytes Relative 04/12/2016 8  % Final  . Monocytes Absolute 04/12/2016 0.6  0.2 - 0.9 K/uL Final  . Eosinophils Relative 04/12/2016 2  % Final  . Eosinophils Absolute 04/12/2016 0.1  0 - 0.7 K/uL Final  . Basophils Relative 04/12/2016 1  % Final  . Basophils Absolute 04/12/2016 0.1  0 - 0.1 K/uL Final  . Sodium 04/12/2016 140  135 - 145 mmol/L Final  . Potassium 04/12/2016 3.3* 3.5 - 5.1 mmol/L Final  . Chloride 04/12/2016 103  101 - 111 mmol/L Final  . CO2 04/12/2016 29  22 - 32 mmol/L Final  . Glucose, Bld 04/12/2016 167* 65 - 99 mg/dL Final  . BUN 04/12/2016 9  6 - 20 mg/dL Final  . Creatinine, Ser 04/12/2016 0.64  0.44 - 1.00 mg/dL Final  . Calcium 04/12/2016 8.5* 8.9 - 10.3 mg/dL Final  . Total Protein 04/12/2016 6.9  6.5 - 8.1 g/dL Final  . Albumin 04/12/2016 3.8  3.5 - 5.0 g/dL Final  . AST 04/12/2016 26  15 - 41 U/L Final  . ALT 04/12/2016 19  14 - 54 U/L Final  . Alkaline Phosphatase 04/12/2016 80  38 - 126 U/L Final  . Total Bilirubin 04/12/2016 0.4  0.3 - 1.2 mg/dL Final  . GFR calc non Af Amer 04/12/2016 >60  >60 mL/min Final  . GFR calc Af Amer 04/12/2016 >60  >60 mL/min Final   Comment: (NOTE) The eGFR has been calculated using the CKD EPI equation. This calculation has not been validated in all clinical situations. eGFR's persistently <60 mL/min signify possible Chronic Kidney Disease.   . Anion gap 04/12/2016 8  5 - 15 Final    Assessment:  Natalie Stark is a 55 y.o. female with stage IIA left lung cancer s/p left upper lobe lobectomy on 09/04/2013.  Pathology revealed a 2.2 cm and a 1.0 mass adenocarcinoma, acinar predominant, in the left upper lobe.  There was a focally suspicious area of involvement in the visceral pleura.  18 lymph nodes were negative.  EGFR and ALK were negative.  Pathologic stage was T3N0M0.  She enrolled on  the [facility] protocol  under [facility].  She received adjuvant cisplatin and Alimta beginning 09/30/2013.  PET scan on 09/22/2014 revealed no evidence of recurrent disease.  Chest CT on 04/30/2015 revealed no evidence of recurrent disease with 2 tiny RUL pulmonary nodules with a stable mildy enlarged right lower paratracheal lymph node.    Chest CT on 03/29/2016 revealed new mild patchy ground-glass opacities in the anterior left lower lobe, probably inflammatory.   Abdomen and pelvic CT scan on 04/07/2016 revealed no acute abnormality.   Bilateral lower extremity duplex on 04/07/2016 revealed no evidence of DVT.    Symptomatically, she has a productive cough.  She has been on Levaquin for 6 days without improvement.  She had hypokalemia (potassium 3.3) on 04/12/2016.  Plan: 1.  Review labs from 04/12/2016. 2.  Sputum culture and sensitivity. 3.  CXR (PA and lateral) today. 4.  Discuss plan to switch antibiotics secondary to persistent symptoms on Levaquin. 5.  Rx:  Augmentin 875-125 mg 1 tablet po q 12 hours; dis: 10 day supply. 6.  Anticipate next chest CT in 3-6 months with follow-up yearly x 3. 7.  RTC in 3 months for MD assessment, labs (CBC with diff, CMP, CEA).  Addendum:  CXR today revealed no active cardiopulmonary disease.  There was slight volume loss of the left lung with left basilar atelectasis.  Over 30 minutes were spent reviewing imaging studies, pathology, triage notes, notes by Dr. Sherrine Maples, and examining and discussing the plan for treatment and follow-up with the patient.   Lequita Asal, MD  04/18/2016, 3:53 PM

## 2016-04-18 NOTE — Telephone Encounter (Signed)
Called pt to ask about her sx. She is still having low grade fever 99.5, she was getting better about coughing and sputum got better but then returned after finishing atb. It is green now again. She still has wheezing.  She feels like she should have more atb.  Spoke to [place] and she wants her to have a cxr today and then see md 3:30

## 2016-04-19 ENCOUNTER — Other Ambulatory Visit: Payer: BLUE CROSS/BLUE SHIELD

## 2016-04-20 LAB — EXPECTORATED SPUTUM ASSESSMENT W GRAM STAIN, RFLX TO RESP C

## 2016-04-21 ENCOUNTER — Other Ambulatory Visit: Payer: BLUE CROSS/BLUE SHIELD

## 2016-04-21 LAB — CULTURE, RESPIRATORY W GRAM STAIN: Culture: NORMAL

## 2016-04-21 NOTE — Progress Notes (Signed)
Ms. Natalie Stark is here to review CT abd and Doppelr results   B/l leg edema and hypokalemia:  Since her last vsit she has been seen  cardiology, she is scheudeled for a stress test and echo, she is now on a higher dose of lasix for pedal edema.  Her leg swellng improved a bit, she has not picked up the script for Potassium  until last night  Venous Doppler  of both legs is negative fo DVT  CT abdomen and pelvis show stable left ovarian cysts. These did not exhibit increased uptake on the previous PET-CT. That and the long-term stability were felt to be compatible with a benign process. Absent right ovary and uterus   Cough and CT chest infiltrate: She also notes new cough and greenish expetoration that began the day after she saw me last week. At the time a CT chest showed an infiltrate suspicious for early pneumonia.. She had apparently called our office yestreday that was routed to Dr. Grayland Ormond who prescribed Levaquin 500 mg per day. She is no tachypneic at rest, vitals are stablea s below.   Today's Vitals   04/12/16 0929  BP: (!) 150/76  Pulse: 96  Temp: 97.7 F (36.5 C)  TempSrc: Tympanic  Weight: 212 lb 9.6 oz (96.4 kg)  Height: '5\' 2"'$  (1.575 m)  PainSc: 7     I have increased the dsoe for Levaquin  To 750 mg per day She will call us if she does not see improvement in cough and sputum in the next 48 hrs. A  CXR aand labs have been ordered and a return visit is set up for 2 weeks.   Knee pain: She requests a presription for Oxycodoen for knee pain, howver, upom  revie wo fher record, it is noted that there were prrio concerns about her overusing opiods, but her pain appears genuine related to the sever arthrits of the knee.  She I sclose follow up with ortho and had previosuly been seen by pain clinic, therefore, I tod her that it is not apporpriate for me toprecribe pain regimen at this time. I have asked her to conatct her orthopedic surgeon  Or if she prefers, I offered  an immediate refrral to pain clinic today. She delcined my offer and left the clinic.  Lung cancers/p  left upper lobectomy and pulmonary nodules needing surveillance: .  Needs a f/u CT in 3 months until resolution of the ew mild patchy ground-glass opacities in the anterior left lower lobe,   I did review the Ct results and Doppler with her prior to her departure and also counselled her about hypokalemia and its risk of arrythmias if uncorrected, she was urged to take potassium as prescribed, increase dietary potassium  and and keep her follow upw with cardiology as scheudled

## 2016-04-22 ENCOUNTER — Ambulatory Visit: Payer: BLUE CROSS/BLUE SHIELD | Admitting: Hematology and Oncology

## 2016-04-22 ENCOUNTER — Inpatient Hospital Stay: Payer: BLUE CROSS/BLUE SHIELD

## 2016-04-28 ENCOUNTER — Ambulatory Visit: Payer: BLUE CROSS/BLUE SHIELD | Admitting: Cardiology

## 2016-04-29 ENCOUNTER — Telehealth: Payer: Self-pay | Admitting: *Deleted

## 2016-04-29 ENCOUNTER — Other Ambulatory Visit: Payer: Self-pay | Admitting: *Deleted

## 2016-04-29 MED ORDER — FLUCONAZOLE 150 MG PO TABS: 150.0000 mg | ORAL_TABLET | Freq: Once | ORAL | 0 refills | Status: AC

## 2016-04-29 NOTE — Telephone Encounter (Signed)
Fluconazole 150 mg po x 1 dose sent to pharmacy.  Called patient to inform her of medication and how to take it and that it was sent to pharmacy. Voiced understanding.

## 2016-04-29 NOTE — Telephone Encounter (Signed)
Was on abx and now has a yeast infection and asking for Diflucan to be called in. PLease advise

## 2016-04-29 NOTE — Telephone Encounter (Signed)
  Where is yeast infection?  M

## 2016-05-03 ENCOUNTER — Telehealth: Payer: Self-pay | Admitting: *Deleted

## 2016-05-03 NOTE — Telephone Encounter (Signed)
I was trf a call from [place] to help pt about her questions about lab check for her potassium, and what about her CT scan.  Here is the note that I sent to [place] in  inbasket for response:  Dr. Bangladesh saw pt 11/21 and in her note she states that pt should be seen in 2 weeks with cxr.  She saw you 11/28 and she had a cxr that day and finnegan had called in atb for on 11/21. According to the pt. She had a low potassium and was put on potassium 20 meq 2 tablets twice a day.  She called today to say that she is almost out of potassium pills and she needs be checked with her labs and that her CT that she says was already scheduled but now has been cancelled ( I can't find where one was ordered or cancelled) needs to be r/s.  She wants to know if someone is going to r/s her ct and if someone is going to monitor her potassium level.  I do see in Dr. Bangladesh note that she should have ct in 3 months and that she had low potassium and she gave her rx for potassium.  Just tell me what to tell her. Thanks [employer]

## 2016-05-04 ENCOUNTER — Other Ambulatory Visit: Payer: Self-pay | Admitting: *Deleted

## 2016-05-04 ENCOUNTER — Telehealth: Payer: Self-pay | Admitting: *Deleted

## 2016-05-04 DIAGNOSIS — E876 Hypokalemia: Secondary | ICD-10-CM

## 2016-05-04 NOTE — Telephone Encounter (Signed)
Called pt back and said that dr Mike Gip got my message and said that you did need potassium check and asked when she would be available and she is coming out to have physical therapy from her total knee she had done a on 12/15 and her appt is at 1:15 she asked to come around 12:30 and I said ok and appt made.  I did tell her that she needs more time to review her records and will let me know about the CT scan. I am hopeful to let her know about it next week if she will order it and patient states she will give her one more chance.

## 2016-05-04 NOTE — Telephone Encounter (Signed)
-----   Message from Lequita Asal, MD sent at 05/03/2016  8:57 PM EST -----  She needs to come in for BMP to check her potassium. No refill at this time.  I will review her chart for CT issue.  M  ----- Message ----- From: Luella Cook, RN Sent: 05/03/2016   2:32 PM To: Lequita Asal, MD  Dr. Bangladesh saw pt 11/21 and in her note she states that pt should be seen in 2 weeks with cxr.  She saw you 11/28 and she had a cxr that day and finnegan had called in atb for on 11/21. According to the pt. She had a low potassium and was put on potassium 20 meq 2 tablets twice a day.  She called today to say that she is almost out of potassium pills and she needs be checked with her labs and that her CT that she says was already scheduled but now has been cancelled ( I can't find where one was ordered or cancelled) needs to be r/s.  She wants to know if someone is going to r/s her ct and if someone is going to monitor her potassium level.  I do see in Dr. Bangladesh note that she should have ct in 3 months and that she had low potassium and she gave her rx for potassium.  Just tell me what to tell her. Thanks [employer]

## 2016-05-06 ENCOUNTER — Other Ambulatory Visit: Payer: Self-pay

## 2016-05-06 ENCOUNTER — Inpatient Hospital Stay: Payer: BLUE CROSS/BLUE SHIELD | Attending: Hematology and Oncology

## 2016-05-06 DIAGNOSIS — Z902 Acquired absence of lung [part of]: Secondary | ICD-10-CM | POA: Diagnosis not present

## 2016-05-06 DIAGNOSIS — Z9221 Personal history of antineoplastic chemotherapy: Secondary | ICD-10-CM | POA: Diagnosis not present

## 2016-05-06 DIAGNOSIS — E876 Hypokalemia: Secondary | ICD-10-CM

## 2016-05-06 DIAGNOSIS — C3412 Malignant neoplasm of upper lobe, left bronchus or lung: Secondary | ICD-10-CM | POA: Insufficient documentation

## 2016-05-06 LAB — BASIC METABOLIC PANEL
Anion gap: 10 (ref 5–15)
BUN: 12 mg/dL (ref 6–20)
CO2: 27 mmol/L (ref 22–32)
Calcium: 9.1 mg/dL (ref 8.9–10.3)
Chloride: 100 mmol/L — ABNORMAL LOW (ref 101–111)
Creatinine, Ser: 0.74 mg/dL (ref 0.44–1.00)
GFR calc Af Amer: >60 mL/min (ref >60)
GFR calc non Af Amer: >60 mL/min (ref >60)
Glucose, Bld: 122 mg/dL — ABNORMAL HIGH (ref 65–99)
Potassium: 3.7 mmol/L (ref 3.5–5.1)
Sodium: 137 mmol/L (ref 135–145)

## 2016-05-19 ENCOUNTER — Other Ambulatory Visit: Payer: Self-pay | Admitting: Cardiology

## 2016-05-19 DIAGNOSIS — R0602 Shortness of breath: Secondary | ICD-10-CM

## 2016-05-30 ENCOUNTER — Ambulatory Visit (INDEPENDENT_AMBULATORY_CARE_PROVIDER_SITE_OTHER): Payer: BLUE CROSS/BLUE SHIELD

## 2016-05-30 ENCOUNTER — Other Ambulatory Visit: Payer: Self-pay

## 2016-05-30 DIAGNOSIS — R0602 Shortness of breath: Secondary | ICD-10-CM

## 2016-06-05 ENCOUNTER — Encounter: Payer: Self-pay | Admitting: Hematology and Oncology

## 2016-06-05 DIAGNOSIS — R05 Cough: Secondary | ICD-10-CM | POA: Insufficient documentation

## 2016-06-05 DIAGNOSIS — R059 Cough, unspecified: Secondary | ICD-10-CM | POA: Insufficient documentation

## 2016-06-05 DIAGNOSIS — J40 Bronchitis, not specified as acute or chronic: Secondary | ICD-10-CM | POA: Insufficient documentation

## 2016-07-17 NOTE — Progress Notes (Signed)
[facility] day:  07/18/2016  Chief Complaint: Natalie Stark is a 56 y.o. female with stage IIA left lung cancer who is seen for 3 month assessment.  HPI: The patient was last seen in the medical oncology clinic on 04/18/2017.  At that time, she was seen for a sick call visit.  She had a productive cough.  She had been on Levaquin for 6 days without improvement.  She had hypokalemia (potassium 3.3).  CXR revealed no active cardiopulmonary disease.  She was prescribed Augmentin.  She developed a yeast infection.  She was prescribed fluconazole 150 mg po x 1.  Symptomatically, she notes that she has bronchitis again.  Two weeks ago she was treated with antibiotics. She has a constipation for which she uses MiraLAX.   Past Medical History:  Diagnosis Date  . Anxiety   . Arthritis   . Cancer ([place]) 08/30/13   Lung, tx removal of upper left lobe and chemotherapy  . Chronic pain    pt states from lung surgery and knee pain  . GERD (gastroesophageal reflux disease)   . Hypertension   . Insomnia   . 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 Skin cancer     Past Surgical History:  Procedure Laterality Date  . ABDOMINAL HYSTERECTOMY    . CESAREAN SECTION    . CHOLECYSTECTOMY  01-02-14  . COLONOSCOPY  05-13-14   Dr Bary Castilla  . left shoulder blade Left 30 years ago   removed extra bone  . NASAL TURBINATE REDUCTION Bilateral 01/08/2016   Procedure: TURBINATE REDUCTION/SUBMUCOSAL RESECTION;  Surgeon: [name], MD;  Location: [facility];  Service: ENT;  Laterality: Bilateral;  . SEPTOPLASTY Bilateral 01/08/2016   Procedure: SEPTOPLASTY;  Surgeon: [name], MD;  Location: [facility];  Service: ENT;  Laterality: Bilateral;  . upper lobectomy Left     Family History  Problem Relation Age of Onset  . Thyroid cancer Mother   . Heart attack Father   .  Thyroid cancer Brother     Social History:  reports that she has been smoking Cigarettes.  She has a 17.50 pack-year smoking history. She has never used smokeless tobacco. She reports that she drinks alcohol. She reports that she does not use drugs.  She lives in [place].  The patient is alone today.  Allergies:  Allergies  Allergen Reactions  . Seroquel [Quetiapine Fumarate] Other (See Comments)    hallunications    Current Medications: Current Outpatient Prescriptions  Medication Sig Dispense Refill  . albuterol (VENTOLIN HFA) 108 (90 Base) MCG/ACT inhaler TAKE TWO PUFFS EVERY 4 TO 6 HOURS AS NEEDED FOR SHORTNESS OF BREATH. 18 g 2  . ibuprofen (ADVIL,MOTRIN) 800 MG tablet as needed.   0  . loperamide (IMODIUM) 2 MG capsule Take by mouth as needed.     Marland Kitchen omeprazole (PRILOSEC) 40 MG capsule Take 1 capsule (40 mg total) by mouth daily. 30 capsule 12  . oxyCODONE (OXYCONTIN) 20 mg 12 hr tablet Take 20 mg by mouth every 12 (twelve) hours.    . OXYCODONE ER PO Take by mouth.    . rizatriptan (MAXALT) 10 MG tablet Take 1 tablet (10 mg total) by mouth as needed for migraine. May repeat in 2 hours if needed 10 tablet 12  . fluconazole (DIFLUCAN) 150 MG tablet as needed.   0  . furosemide (LASIX) 20 MG tablet  Take 2 tablets (40 mg total) by mouth 2 (two) times daily. 120 tablet 0  . potassium chloride SA (K-DUR,KLOR-CON) 20 MEQ tablet Take 40 mEq by mouth 2 (two) times daily.      No current facility-administered medications for this visit.     Review of Systems:  GENERAL:  Fatigue.  No fever, chills or sweats.  Weight down 12 pounds. PERFORMANCE STATUS (ECOG):  1 HEENT:  No visual changes, runny nose, sore throat, mouth sores or tenderness. Lungs:  Interval bronchitis.  Shortness of breath on exertion.  Cough.  No hemoptysis. Cardiac:  No chest pain, palpitations, orthopnea, or PND. GI:  Constipation (see HPI).  No nausea, vomiting, diarrhea, melena or hematochezia. GU:  No urgency,  frequency, dysuria, or hematuria. Musculoskeletal:  No back pain.  Knee pain.  No muscle tenderness. Extremities:  No pain or swelling. Skin:  No rashes or skin changes. Neuro:  No headache, numbness or weakness, balance or coordination issues. Endocrine:  No diabetes, thyroid issues, hot flashes or night sweats. Psych:  No mood changes, depression or anxiety. Pain:  Knee pain (10 out of 10). Review of systems:  All other systems reviewed and found to be negative.  Physical Exam: Blood pressure (!) 151/89, pulse 93, temperature 97.1 F (36.2 C), temperature source Tympanic, resp. rate 18, weight 210 lb 13.9 oz (95.7 kg), SpO2 100 %. GENERAL:  Well developed, well nourished, woman sitting comfortably in the exam room in no acute distress.  MENTAL STATUS:  Alert and oriented to person, place and time. HEAD:  Long brown hair.  Normocephalic, atraumatic, face symmetric, no Cushingoid features. EYES:  Glasses.  Blue eyes.  Pupils equal round and reactive to light and accomodation.  No conjunctivitis or scleral icterus. ENT:  Oropharynx clear without lesion.  Tongue normal. Mucous membranes moist.  RESPIRATORY:  Scattered soft wheezes.  No rales or rhonchi. CARDIOVASCULAR:  Regular rate and rhythm without murmur, rub or gallop. ABDOMEN:  Soft, non-tender, with active bowel sounds, and no hepatosplenomegaly.  No masses. SKIN:  No rashes, ulcers or lesions. EXTREMITIES: No edema, no skin discoloration or tenderness.  No palpable cords. LYMPH NODES: No palpable cervical, supraclavicular, axillary or inguinal adenopathy  NEUROLOGICAL: Unremarkable. PSYCH:  Appropriate.   Appointment on 07/18/2016  Component Date Value Ref Range Status  . WBC 07/18/2016 12.1* 3.6 - 11.0 K/uL Final  . RBC 07/18/2016 4.56  3.80 - 5.20 MIL/uL Final  . Hemoglobin 07/18/2016 13.4  12.0 - 16.0 g/dL Final  . HCT 07/18/2016 38.9  35.0 - 47.0 % Final  . MCV 07/18/2016 85.3  80.0 - 100.0 fL Final  . MCH 07/18/2016 29.3   26.0 - 34.0 pg Final  . MCHC 07/18/2016 34.4  32.0 - 36.0 g/dL Final  . RDW 07/18/2016 13.8  11.5 - 14.5 % Final  . Platelets 07/18/2016 312  150 - 440 K/uL Final  . Neutrophils Relative % 07/18/2016 58  % Final  . Neutro Abs 07/18/2016 7.2* 1.4 - 6.5 K/uL Final  . Lymphocytes Relative 07/18/2016 33  % Final  . Lymphs Abs 07/18/2016 4.0* 1.0 - 3.6 K/uL Final  . Monocytes Relative 07/18/2016 6  % Final  . Monocytes Absolute 07/18/2016 0.7  0.2 - 0.9 K/uL Final  . Eosinophils Relative 07/18/2016 2  % Final  . Eosinophils Absolute 07/18/2016 0.2  0 - 0.7 K/uL Final  . Basophils Relative 07/18/2016 1  % Final  . Basophils Absolute 07/18/2016 0.1  0 - 0.1 K/uL Final  .  Sodium 07/18/2016 139  135 - 145 mmol/L Final  . Potassium 07/18/2016 3.3* 3.5 - 5.1 mmol/L Final  . Chloride 07/18/2016 100* 101 - 111 mmol/L Final  . CO2 07/18/2016 31  22 - 32 mmol/L Final  . Glucose, Bld 07/18/2016 118* 65 - 99 mg/dL Final  . BUN 07/18/2016 12  6 - 20 mg/dL Final  . Creatinine, Ser 07/18/2016 0.66  0.44 - 1.00 mg/dL Final  . Calcium 07/18/2016 9.1  8.9 - 10.3 mg/dL Final  . Total Protein 07/18/2016 7.6  6.5 - 8.1 g/dL Final  . Albumin 07/18/2016 4.4  3.5 - 5.0 g/dL Final  . AST 07/18/2016 24  15 - 41 U/L Final  . ALT 07/18/2016 20  14 - 54 U/L Final  . Alkaline Phosphatase 07/18/2016 77  38 - 126 U/L Final  . Total Bilirubin 07/18/2016 0.5  0.3 - 1.2 mg/dL Final  . GFR calc non Af Amer 07/18/2016 >60  >60 mL/min Final  . GFR calc Af Amer 07/18/2016 >60  >60 mL/min Final   Comment: (NOTE) The eGFR has been calculated using the CKD EPI equation. This calculation has not been validated in all clinical situations. eGFR's persistently <60 mL/min signify possible Chronic Kidney Disease.   . Anion gap 07/18/2016 8  5 - 15 Final    Assessment:  Natalie Stark is a 56 y.o. female with stage IIA left lung cancer s/p left upper lobe lobectomy on 09/04/2013.  Pathology revealed a 2.2 cm and a 1.0 mass  adenocarcinoma, acinar predominant, in the left upper lobe.  There was a focally suspicious area of involvement in the visceral pleura.  18 lymph nodes were negative.  EGFR and ALK were negative.  Pathologic stage was T3N0M0.  She enrolled on the [facility] protocol under [facility].  She received adjuvant cisplatin and Alimta beginning 09/30/2013.  PET scan on 09/22/2014 revealed no evidence of recurrent disease.  Chest CT on 04/30/2015 revealed no evidence of recurrent disease with 2 tiny RUL pulmonary nodules with a stable mildy enlarged right lower paratracheal lymph node.    Chest CT on 03/25/2016 revealed new mild patchy ground-glass opacities in the anterior left lower lobe, probably inflammatory.   Abdomen and pelvic CT scan on 04/07/2016 revealed no acute abnormality.   Bilateral lower extremity duplex on 04/07/2016 revealed no evidence of DVT.    Symptomatically, she has had bronchitis with recent antibiotic treatment.  Exam reveals scattered wheezing.  She has hypokalemia (potassium 3.3).  Plan: 1.  Review labs from 04/12/2016. 2.  Labs today:  CBC with diff, CMP, CEA. 3.  Phone follow-up with Dr. Keturah Barre today. 4.  Chest CT without contrast next week. 5.  Patient would like to follow-up with primary care physician.  Addendum:  Chest CT on 08/10/2016 revealed interval resolution of inflammatory ground-glass opacities in the left lower lobe.  There was no evidence of local tumor recurrence in the left lung s/p left upper lobectomy.   There were no findings suspicious for metastatic disease in the chest.  There was continued stability of subsolid 7 mm right lower lobe pulmonary nodule, for which 3 year stability has been demonstrated (probably benign).   Recommend ongoing follow-up every 6 months with imaging until 5 years post treatment then annual low dose chest CT scan.   Lequita Asal, MD  07/18/2016, 12:33 PM

## 2016-07-18 ENCOUNTER — Inpatient Hospital Stay (HOSPITAL_BASED_OUTPATIENT_CLINIC_OR_DEPARTMENT_OTHER): Payer: BLUE CROSS/BLUE SHIELD | Admitting: Hematology and Oncology

## 2016-07-18 ENCOUNTER — Inpatient Hospital Stay: Payer: BLUE CROSS/BLUE SHIELD | Attending: Hematology and Oncology

## 2016-07-18 ENCOUNTER — Encounter: Payer: Self-pay | Admitting: Hematology and Oncology

## 2016-07-18 VITALS — BP 151/89 | HR 93 | Temp 97.1°F | Resp 18 | Wt 210.9 lb

## 2016-07-18 DIAGNOSIS — C3412 Malignant neoplasm of upper lobe, left bronchus or lung: Secondary | ICD-10-CM | POA: Diagnosis not present

## 2016-07-18 DIAGNOSIS — R05 Cough: Secondary | ICD-10-CM

## 2016-07-18 DIAGNOSIS — R059 Cough, unspecified: Secondary | ICD-10-CM

## 2016-07-18 DIAGNOSIS — Z9221 Personal history of antineoplastic chemotherapy: Secondary | ICD-10-CM | POA: Insufficient documentation

## 2016-07-18 DIAGNOSIS — J40 Bronchitis, not specified as acute or chronic: Secondary | ICD-10-CM

## 2016-07-18 DIAGNOSIS — Z902 Acquired absence of lung [part of]: Secondary | ICD-10-CM | POA: Diagnosis not present

## 2016-07-18 DIAGNOSIS — C3492 Malignant neoplasm of unspecified part of left bronchus or lung: Secondary | ICD-10-CM

## 2016-07-18 DIAGNOSIS — C3402 Malignant neoplasm of left main bronchus: Secondary | ICD-10-CM

## 2016-07-18 LAB — CBC WITH DIFFERENTIAL/PLATELET
Basophils Absolute: 0.1 10*3/uL (ref 0–0.1)
Basophils Relative: 1 %
Eosinophils Absolute: 0.2 10*3/uL (ref 0–0.7)
Eosinophils Relative: 2 %
HCT: 38.9 % (ref 35.0–47.0)
Hemoglobin: 13.4 g/dL (ref 12.0–16.0)
Lymphocytes Relative: 33 %
Lymphs Abs: 4 10*3/uL — ABNORMAL HIGH (ref 1.0–3.6)
MCH: 29.3 pg (ref 26.0–34.0)
MCHC: 34.4 g/dL (ref 32.0–36.0)
MCV: 85.3 fL (ref 80.0–100.0)
Monocytes Absolute: 0.7 10*3/uL (ref 0.2–0.9)
Monocytes Relative: 6 %
Neutro Abs: 7.2 10*3/uL — ABNORMAL HIGH (ref 1.4–6.5)
Neutrophils Relative %: 58 %
Platelets: 312 10*3/uL (ref 150–440)
RBC: 4.56 MIL/uL (ref 3.80–5.20)
RDW: 13.8 % (ref 11.5–14.5)
WBC: 12.1 10*3/uL — ABNORMAL HIGH (ref 3.6–11.0)

## 2016-07-18 LAB — COMPREHENSIVE METABOLIC PANEL
ALT: 20 U/L (ref 14–54)
AST: 24 U/L (ref 15–41)
Albumin: 4.4 g/dL (ref 3.5–5.0)
Alkaline Phosphatase: 77 U/L (ref 38–126)
Anion gap: 8 (ref 5–15)
BUN: 12 mg/dL (ref 6–20)
CO2: 31 mmol/L (ref 22–32)
Calcium: 9.1 mg/dL (ref 8.9–10.3)
Chloride: 100 mmol/L — ABNORMAL LOW (ref 101–111)
Creatinine, Ser: 0.66 mg/dL (ref 0.44–1.00)
GFR calc Af Amer: >60 mL/min (ref >60)
GFR calc non Af Amer: >60 mL/min (ref >60)
Glucose, Bld: 118 mg/dL — ABNORMAL HIGH (ref 65–99)
Potassium: 3.3 mmol/L — ABNORMAL LOW (ref 3.5–5.1)
Sodium: 139 mmol/L (ref 135–145)
Total Bilirubin: 0.5 mg/dL (ref 0.3–1.2)
Total Protein: 7.6 g/dL (ref 6.5–8.1)

## 2016-07-19 LAB — CEA: CEA: 7.4 ng/mL — ABNORMAL HIGH (ref 0.0–4.7)

## 2016-07-26 ENCOUNTER — Ambulatory Visit: Admission: RE | Admit: 2016-07-26 | Payer: BLUE CROSS/BLUE SHIELD | Source: Ambulatory Visit

## 2016-08-10 ENCOUNTER — Ambulatory Visit
Admission: RE | Admit: 2016-08-10 | Discharge: 2016-08-10 | Disposition: A | Discharge status: Home / Self Care | Payer: BLUE CROSS/BLUE SHIELD | Source: Ambulatory Visit | Attending: Hematology and Oncology | Admitting: Hematology and Oncology

## 2016-08-10 DIAGNOSIS — Z902 Acquired absence of lung [part of]: Secondary | ICD-10-CM | POA: Insufficient documentation

## 2016-08-10 DIAGNOSIS — Z08 Encounter for follow-up examination after completed treatment for malignant neoplasm: Secondary | ICD-10-CM | POA: Diagnosis present

## 2016-08-10 DIAGNOSIS — Z9049 Acquired absence of other specified parts of digestive tract: Secondary | ICD-10-CM | POA: Insufficient documentation

## 2016-08-10 DIAGNOSIS — C3402 Malignant neoplasm of left main bronchus: Secondary | ICD-10-CM

## 2016-08-10 DIAGNOSIS — N281 Cyst of kidney, acquired: Secondary | ICD-10-CM | POA: Diagnosis not present

## 2016-08-10 DIAGNOSIS — Z9221 Personal history of antineoplastic chemotherapy: Secondary | ICD-10-CM | POA: Insufficient documentation

## 2016-08-10 DIAGNOSIS — R911 Solitary pulmonary nodule: Secondary | ICD-10-CM | POA: Insufficient documentation

## 2016-08-10 DIAGNOSIS — M47814 Spondylosis without myelopathy or radiculopathy, thoracic region: Secondary | ICD-10-CM | POA: Diagnosis not present

## 2016-08-10 DIAGNOSIS — Z85118 Personal history of other malignant neoplasm of bronchus and lung: Secondary | ICD-10-CM | POA: Insufficient documentation

## 2016-08-10 MED ORDER — IOPAMIDOL (ISOVUE-300) INJECTION 61%
75.0000 mL | Freq: Once | INTRAVENOUS | Status: AC | PRN
  Administered 2016-08-10: 75 mL via INTRAVENOUS

## 2016-08-15 ENCOUNTER — Ambulatory Visit: Payer: BLUE CROSS/BLUE SHIELD | Admitting: Oncology

## 2016-08-16 NOTE — Progress Notes (Deleted)
[facility]  [phone] [phone]  ID: Natalie Stark OB: Stark 14, 1962  MR#: [id number]  FBP#:[id number]  Patient Care Team: Pcp Not In System as PCP - General Forest Gleason, MD (Unknown Physician Specialty) Robert Bellow, MD (General Surgery)  CHIEF COMPLAINT: ***  INTERVAL HISTORY: ***  REVIEW OF SYSTEMS:   ROS  As per HPI. Otherwise, a complete review of systems is negative.  PAST MEDICAL HISTORY: Past Medical History:  Diagnosis Date  . Anxiety   . Arthritis   . Cancer ([place]) 08/30/13   Lung, tx removal of upper left lobe and chemotherapy  . Chronic pain    pt states from lung surgery and knee pain  . GERD (gastroesophageal reflux disease)   . Insomnia   . 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 Skin cancer     PAST SURGICAL HISTORY: Past Surgical History:  Procedure Laterality Date  . ABDOMINAL HYSTERECTOMY    . CESAREAN SECTION    . CHOLECYSTECTOMY  01-02-14  . COLONOSCOPY  05-13-14   Dr Bary Castilla  . left shoulder blade Left 30 years ago   removed extra bone  . NASAL TURBINATE REDUCTION Bilateral 01/08/2016   Procedure: TURBINATE REDUCTION/SUBMUCOSAL RESECTION;  Surgeon: [name], MD;  Location: [facility];  Service: ENT;  Laterality: Bilateral;  . SEPTOPLASTY Bilateral 01/08/2016   Procedure: SEPTOPLASTY;  Surgeon: [name], MD;  Location: [facility];  Service: ENT;  Laterality: Bilateral;  . upper lobectomy Left     FAMILY HISTORY: Family History  Problem Relation Age of Onset  . Thyroid cancer Mother   . Heart attack Father   . Thyroid cancer Brother     ADVANCED DIRECTIVES (Y/N):  N  HEALTH MAINTENANCE: Social History  Substance Use Topics  . Smoking status: Current Some Day Smoker    Packs/day: 0.50    Years: 35.00    Types: Cigarettes  . Smokeless tobacco: Never Used  . Alcohol use 0.0 oz/week   Comment: rare     Colonoscopy:  PAP:  Bone density:  Lipid panel:  Allergies  Allergen Reactions  . Seroquel [Quetiapine Fumarate] Other (See Comments)    hallunications    Current Outpatient Prescriptions  Medication Sig Dispense Refill  . albuterol (VENTOLIN HFA) 108 (90 Base) MCG/ACT inhaler TAKE TWO PUFFS EVERY 4 TO 6 HOURS AS NEEDED FOR SHORTNESS OF BREATH. 18 g 2  . fluconazole (DIFLUCAN) 150 MG tablet as needed.   0  . furosemide (LASIX) 20 MG tablet Take 2 tablets (40 mg total) by mouth 2 (two) times daily. 120 tablet 0  . ibuprofen (ADVIL,MOTRIN) 800 MG tablet as needed.   0  . loperamide (IMODIUM) 2 MG capsule Take by mouth as needed.     Marland Kitchen omeprazole (PRILOSEC) 40 MG capsule Take 1 capsule (40 mg total) by mouth daily. 30 capsule 12  . oxyCODONE (OXYCONTIN) 20 mg 12 hr tablet Take 20 mg by mouth every 12 (twelve) hours.    . OXYCODONE ER PO Take by mouth.    . potassium chloride SA (K-DUR,KLOR-CON) 20 MEQ tablet Take 40 mEq by mouth 2 (two) times daily.     . rizatriptan (MAXALT) 10 MG tablet Take 1 tablet (10 mg total) by mouth as needed for migraine. May repeat in 2 hours if needed 10 tablet 12   No current facility-administered medications for this visit.     OBJECTIVE: There were no vitals  filed for this visit.   There is no height or weight on file to calculate BMI.    ECOG FS:{CHL ONC [id number]  General: Well-developed, well-nourished, no acute distress. Eyes: Pink conjunctiva, anicteric sclera. HEENT: Normocephalic, moist mucous membranes, clear oropharnyx. Lungs: Clear to auscultation bilaterally. Heart: Regular rate and rhythm. No rubs, murmurs, or gallops. Abdomen: Soft, nontender, nondistended. No organomegaly noted, normoactive bowel sounds. Musculoskeletal: No edema, cyanosis, or clubbing. Neuro: Alert, answering all questions appropriately. Cranial nerves grossly intact. Skin: No rashes or petechiae noted. Psych: Normal affect. Lymphatics: No  cervical, calvicular, axillary or inguinal LAD.   LAB RESULTS:  Lab Results  Component Value Date   NA 139 07/18/2016   K 3.3 (L) 07/18/2016   CL 100 (L) 07/18/2016   CO2 31 07/18/2016   GLUCOSE 118 (H) 07/18/2016   BUN 12 07/18/2016   CREATININE 0.66 07/18/2016   CALCIUM 9.1 07/18/2016   PROT 7.6 07/18/2016   ALBUMIN 4.4 07/18/2016   AST 24 07/18/2016   ALT 20 07/18/2016   ALKPHOS 77 07/18/2016   BILITOT 0.5 07/18/2016   GFRNONAA >60 07/18/2016   GFRAA >60 07/18/2016    Lab Results  Component Value Date   WBC 12.1 (H) 07/18/2016   NEUTROABS 7.2 (H) 07/18/2016   HGB 13.4 07/18/2016   HCT 38.9 07/18/2016   MCV 85.3 07/18/2016   PLT 312 07/18/2016     STUDIES: Ct Chest W Contrast  Result Date: 08/10/2016 CLINICAL DATA:  Status post left upper lobectomy 08/30/2013 for lung carcinoma treated with chemotherapy. Patient presents for restaging. EXAM: CT CHEST WITH CONTRAST TECHNIQUE: Multidetector CT imaging of the chest was performed during intravenous contrast administration. CONTRAST:  71m ISOVUE-300 IOPAMIDOL (ISOVUE-300) INJECTION 61% COMPARISON:  03/25/2016 chest CT. FINDINGS: Cardiovascular: Stable top-normal heart size. No significant pericardial fluid/thickening. Great vessels are normal in course and caliber. No central pulmonary emboli. Mediastinum/Nodes: Stable subcentimeter hypodense left thyroid lobe nodule . Unremarkable esophagus. No pathologically enlarged axillary, mediastinal or hilar lymph nodes. Lungs/Pleura: No pneumothorax. No pleural effusion. Status post left upper lobectomy. Previously described patchy ground-glass opacities in the anterior left lower lobe have resolved. No acute consolidative airspace disease. Tiny peripheral right upper lobe pulmonary nodules measuring up to 3 mm (series 3/image 30) are stable back to 07/13/2013 and considered benign. Posterior right lower lobe subsolid 7 mm posterior right lower lobe pulmonary nodule is stable back to  07/13/2013 and probably benign. No new significant pulmonary nodules. Stable postsurgical scarring in the anterior left lower lobe. Upper abdomen: Cholecystectomy. Stable simple 0.2 cm upper left renal cyst. Subcentimeter hypodense renal cortical lesion in the posterior upper left kidney is too small to characterize and requires no further follow-up. Musculoskeletal: No aggressive appearing focal osseous lesions. Stable mild post thoracotomy deformity in the posterior left fifth rib. Minimal thoracic spondylosis. IMPRESSION: 1. Interval resolution of inflammatory ground-glass opacities in the left lower lobe. 2. No evidence of local tumor recurrence in the left lung status post left upper lobectomy. No findings suspicious for metastatic disease in the chest. 3. Continued stability of subsolid 7 mm right lower lobe pulmonary nodule, for which 3 year stability has been demonstrated, probably benign. Annual CT is recommended until 5 years of stability has been established. This recommendation follows the consensus statement: Guidelines for Management of Incidental Pulmonary Nodules Detected on CT Images: From the Fleischner Society 2017; Radiology 2017; 284:228-243. Electronically Signed   By: JIlona SorrelM.D.   On: 08/10/2016 15:57    ASSESSMENT:  PLAN:    Patient expressed understanding and was in agreement with this plan. She also understands that She can call clinic at any time with any questions, concerns, or complaints.   Cancer Staging Lung cancer [facility]) Staging form: Lung, AJCC 7th Edition - Clinical: Stage IIB (yT3, N0, M0) - Unsigned   Lloyd Huger, MD   08/16/2016 10:36 PM

## 2016-08-17 ENCOUNTER — Inpatient Hospital Stay: Payer: BLUE CROSS/BLUE SHIELD | Admitting: Oncology

## 2016-09-21 ENCOUNTER — Telehealth: Payer: Self-pay | Admitting: *Deleted

## 2016-09-21 ENCOUNTER — Other Ambulatory Visit: Payer: Self-pay | Admitting: *Deleted

## 2016-09-21 DIAGNOSIS — C3402 Malignant neoplasm of left main bronchus: Secondary | ICD-10-CM

## 2016-09-21 NOTE — Telephone Encounter (Signed)
Called and discussed appts with patient, she refuses to see DR. Mike Gip and said she had an appt with Dr. Maryjane Hurter that she missed. Patient encouraged to reschedule appt with DR. Maryjane Hurter for f/u asap. voicedunderstanding.

## 2016-09-21 NOTE — Telephone Encounter (Signed)
Called patient and left message that her PCP was not comfortable f/u on her cancer and that dr. Mike Gip was going to order a scan in September with a f/u after and to call with questions. Information sent to scheduling.

## 2016-10-04 NOTE — Progress Notes (Signed)
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  INTERVAL HISTORY: Patient returns to clinic today for further evaluation and discussion of her imaging results. She currently feels well and is asymptomatic. She has no neurologic complaints. She denies any recent fevers or illnesses. She has a good appetite and denies weight loss. She has no chest pain, cough, hemoptysis, or shortness of breath. She denies any nausea, vomiting, constipation, or diarrhea. She has no urinary complaints. Patient feels at her baseline and offers no specific complaints today.  REVIEW OF SYSTEMS:   Review of Systems  Constitutional: Negative.  Negative for fever, malaise/fatigue and weight loss.  Respiratory: Negative.  Negative for cough and shortness of breath.   Cardiovascular: Negative.  Negative for chest pain and leg swelling.  Gastrointestinal: Negative.  Negative for abdominal pain.  Genitourinary: Negative.   Musculoskeletal: Negative.   Skin: Negative.  Negative for rash.  Neurological: Negative.  Negative for sensory change and weakness.  Psychiatric/Behavioral: Negative.  The patient is not nervous/anxious.     As per HPI. Otherwise, a complete review of systems is negative.  PAST MEDICAL HISTORY: Past Medical History:  Diagnosis Date  . Anxiety   . Arthritis   . Cancer ([place]) 08/30/13   Lung, tx removal of upper left lobe and chemotherapy  . Chronic pain    pt states from lung surgery and knee pain  . GERD (gastroesophageal reflux disease)   . Insomnia   . Secondary malignant neoplasm of hilus of lung with unknown primary site, unspecified laterality ([place])   . Skin cancer      PAST SURGICAL HISTORY: Past Surgical History:  Procedure Laterality Date  . ABDOMINAL HYSTERECTOMY    . CESAREAN SECTION    . CHOLECYSTECTOMY  01-02-14  . COLONOSCOPY  05-13-14   Dr Bary Castilla  . left shoulder blade Left 30 years ago   removed extra bone  . NASAL TURBINATE REDUCTION Bilateral 01/08/2016   Procedure: TURBINATE REDUCTION/SUBMUCOSAL RESECTION;  Surgeon: [name], MD;  Location: [facility];  Service: ENT;  Laterality: Bilateral;  . SEPTOPLASTY Bilateral 01/08/2016   Procedure: SEPTOPLASTY;  Surgeon: [name], MD;  Location: [facility];  Service: ENT;  Laterality: Bilateral;  . upper lobectomy Left     FAMILY HISTORY: Family History  Problem Relation Age of Onset  . Thyroid cancer Mother   . Heart attack Father   . Thyroid cancer Brother     ADVANCED DIRECTIVES (Y/N):  N  HEALTH MAINTENANCE: Social History  Substance Use Topics  . Smoking status: Current Some Day Smoker    Packs/day: 0.50    Years: 35.00    Types: Cigarettes  . Smokeless tobacco: Never Used  . Alcohol use 0.0 oz/week     Comment: rare     Colonoscopy:  PAP:  Bone density:  Lipid panel:  Allergies  Allergen Reactions  . Seroquel [Quetiapine Fumarate] Other (See Comments)    hallunications    Current Outpatient Prescriptions  Medication Sig Dispense Refill  . albuterol (VENTOLIN HFA) 108 (90 Base) MCG/ACT inhaler TAKE TWO PUFFS EVERY 4 TO 6 HOURS AS NEEDED FOR SHORTNESS OF BREATH. 18 g 2  . fluconazole (DIFLUCAN) 150 MG tablet as needed.  0  . ibuprofen (ADVIL,MOTRIN) 800 MG tablet as needed.   0  . loperamide (IMODIUM) 2 MG capsule Take by mouth as needed.     Marland Kitchen omeprazole (PRILOSEC) 40 MG capsule Take 1 capsule (40 mg total) by mouth daily. 30 capsule 12  . oxyCODONE (OXYCONTIN) 20 mg 12 hr tablet Take 20 mg by mouth every 12 (twelve) hours.    . OXYCODONE ER PO Take by mouth.    . potassium chloride SA (K-DUR,KLOR-CON) 20 MEQ tablet Take 40 mEq  by mouth 2 (two) times daily.     . rizatriptan (MAXALT) 10 MG tablet Take 1 tablet (10 mg total) by mouth as needed for migraine. May repeat in 2 hours if needed 10 tablet 12  . furosemide (LASIX) 20 MG tablet Take 2 tablets (40 mg total) by mouth 2 (two) times daily. 120 tablet 0   No current facility-administered medications for this visit.     OBJECTIVE: Vitals:   10/06/16 1422  BP: (!) 150/76  Pulse: 92  Resp: 20  Temp: 97.4 F (36.3 C)     Body mass index is 39.16 kg/m.    ECOG FS:0 - Asymptomatic  General: Well-developed, well-nourished, no acute distress. Eyes: Pink conjunctiva, anicteric sclera. Lungs: Clear to auscultation bilaterally. Heart: Regular rate and rhythm. No rubs, murmurs, or gallops. Abdomen: Soft, nontender, nondistended. No organomegaly noted, normoactive bowel sounds. Musculoskeletal: No edema, cyanosis, or clubbing. Neuro: Alert, answering all questions appropriately. Cranial nerves grossly intact. Skin: No rashes or petechiae noted. Psych: Normal affect.   LAB RESULTS:  Lab Results  Component Value Date   NA 139 07/18/2016   K 3.3 (L) 07/18/2016   CL 100 (L) 07/18/2016   CO2 31 07/18/2016   GLUCOSE 118 (H) 07/18/2016   BUN 12 07/18/2016   CREATININE 0.66 07/18/2016   CALCIUM 9.1 07/18/2016   PROT 7.6 07/18/2016   ALBUMIN 4.4 07/18/2016   AST 24 07/18/2016   ALT 20 07/18/2016   ALKPHOS 77 07/18/2016   BILITOT 0.5 07/18/2016   GFRNONAA >60 07/18/2016   GFRAA >60 07/18/2016    Lab Results  Component Value Date   WBC 12.1 (H) 07/18/2016   NEUTROABS 7.2 (H) 07/18/2016   HGB 13.4 07/18/2016   HCT 38.9 07/18/2016   MCV 85.3 07/18/2016   PLT 312 07/18/2016     STUDIES: No results found.  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 18 lymph nodes were negative. EGFR and ALK were  negative as well. She subsequently received 4 cycles of adjuvant cisplatin and Alimta between May and August 2015. She did not receive maintenance Alimta. Her most recent CT of the chest on August 10, 2016 did not reveal evidence of recurrence or metastatic disease. Patient is now nearly 3 years removed from completing her treatments and can be switched to yearly visits. Return to clinic in 1 year with repeat imaging and further evaluation.  Approximately 20 minutes was spent in discussion of which greater than 50% was consultation.  Patient expressed understanding and was in agreement with this plan. She also understands that She can call clinic at any time with any questions, concerns, or complaints.   Cancer Staging Lung cancer [facility]) Staging form: Lung, AJCC 7th Edition - Clinical: Stage IIB (yT3, N0, M0) - [place],  MD   10/09/2016 9:43 PM

## 2016-10-06 ENCOUNTER — Inpatient Hospital Stay: Payer: Medicaid Other | Attending: Oncology | Admitting: Oncology

## 2016-10-06 ENCOUNTER — Encounter (INDEPENDENT_AMBULATORY_CARE_PROVIDER_SITE_OTHER): Payer: Self-pay

## 2016-10-06 VITALS — BP 150/76 | HR 92 | Temp 97.4°F | Resp 20 | Wt 214.1 lb

## 2016-10-06 DIAGNOSIS — C3402 Malignant neoplasm of left main bronchus: Secondary | ICD-10-CM

## 2016-10-06 DIAGNOSIS — Z808 Family history of malignant neoplasm of other organs or systems: Secondary | ICD-10-CM | POA: Diagnosis not present

## 2016-10-06 DIAGNOSIS — F17210 Nicotine dependence, cigarettes, uncomplicated: Secondary | ICD-10-CM | POA: Insufficient documentation

## 2016-10-06 DIAGNOSIS — Z85118 Personal history of other malignant neoplasm of bronchus and lung: Secondary | ICD-10-CM | POA: Diagnosis not present

## 2016-10-06 DIAGNOSIS — Z79899 Other long term (current) drug therapy: Secondary | ICD-10-CM | POA: Insufficient documentation

## 2016-10-06 DIAGNOSIS — Z9221 Personal history of antineoplastic chemotherapy: Secondary | ICD-10-CM | POA: Diagnosis not present

## 2016-10-06 DIAGNOSIS — K219 Gastro-esophageal reflux disease without esophagitis: Secondary | ICD-10-CM | POA: Insufficient documentation

## 2016-10-06 DIAGNOSIS — Z85828 Personal history of other malignant neoplasm of skin: Secondary | ICD-10-CM | POA: Insufficient documentation

## 2016-10-06 NOTE — Progress Notes (Signed)
Patient denies any concerns today.  

## 2016-11-22 NOTE — Telephone Encounter (Signed)
done

## 2016-12-13 ENCOUNTER — Telehealth: Payer: Self-pay | Admitting: Cardiology

## 2016-12-13 NOTE — Telephone Encounter (Signed)
Received records request from Disability Determination services , forwarded to [facility] for processing.

## 2017-02-17 ENCOUNTER — Ambulatory Visit: Payer: BLUE CROSS/BLUE SHIELD

## 2017-02-20 ENCOUNTER — Ambulatory Visit: Payer: BLUE CROSS/BLUE SHIELD | Admitting: Hematology and Oncology

## 2017-06-09 NOTE — Progress Notes (Signed)
error 

## 2017-08-01 ENCOUNTER — Encounter: Payer: Self-pay | Admitting: Obstetrics and Gynecology

## 2017-08-01 ENCOUNTER — Ambulatory Visit (INDEPENDENT_AMBULATORY_CARE_PROVIDER_SITE_OTHER): Payer: Medicaid Other | Admitting: Obstetrics and Gynecology

## 2017-08-01 VITALS — BP 128/88 | Ht 62.0 in | Wt 214.0 lb

## 2017-08-01 DIAGNOSIS — N839 Noninflammatory disorder of ovary, fallopian tube and broad ligament, unspecified: Secondary | ICD-10-CM | POA: Diagnosis not present

## 2017-08-01 DIAGNOSIS — N838 Other noninflammatory disorders of ovary, fallopian tube and broad ligament: Secondary | ICD-10-CM

## 2017-08-01 NOTE — Progress Notes (Addendum)
Obstetrics & Gynecology Office Visit   Chief Complaint  Patient presents with  . Referral    "mass on left ovary"  Referral from [place], FNP  History of Present Illness: 57 y.o. female who is seen today in referral from the above for a left ovarian cyst seen on MRI of her lower back.  This procedure was done 4 months ago. She denies pain on her left side. She had a hysterectomy in 2004 for heavy vaginal bleeding.  Her right ovary was removed at the time of the surgery.  She denies vaginal bleeding and any other vaginal symptoms.  She is unsure of whether she has a cervix or not.  She has not had a pelvic exam in a while (years).  The patient shows me the report on her phone through the [facility] portal. The report states that she has a 3.5 cm multilocular cystic lesion in the left adnexal region.  This study was performed on 03/04/2017.    Past Medical History:  Diagnosis Date  . Anxiety   . Arthritis   . Cancer ([place]) 08/30/13   Lung, tx removal of upper left lobe and chemotherapy  . Chronic pain    pt states from lung surgery and knee pain  . GERD (gastroesophageal reflux disease)   . Insomnia   . 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 Skin cancer     Past Surgical History:  Procedure Laterality Date  . ABDOMINAL HYSTERECTOMY    . CESAREAN SECTION    . CHOLECYSTECTOMY  01-02-14  . COLONOSCOPY  05-13-14   Dr Bary Castilla  . left shoulder blade Left 30 years ago   removed extra bone  . NASAL TURBINATE REDUCTION Bilateral 01/08/2016   Procedure: TURBINATE REDUCTION/SUBMUCOSAL RESECTION;  Surgeon: [name], MD;  Location: [facility];  Service: ENT;  Laterality: Bilateral;  . SEPTOPLASTY Bilateral 01/08/2016   Procedure: SEPTOPLASTY;  Surgeon: [name], MD;  Location: [facility];  Service: ENT;  Laterality: Bilateral;  . upper lobectomy Left      Gynecologic History: No LMP recorded. Patient has had a hysterectomy.  Obstetric History: [id number], SVD for first two children. C-section for third child.  Family History  Problem Relation Age of Onset  . Thyroid cancer Mother   . Heart attack Father   . Thyroid cancer Brother     Social History   Socioeconomic History  . Marital status: Married    Spouse name: Not on file  . Number of children: Not on file  . Years of education: Not on file  . Highest education level: Not on file  Social Needs  . Financial resource strain: Not on file  . Food insecurity - worry: Not on file  . Food insecurity - inability: Not on file  . Transportation needs - medical: Not on file  . Transportation needs - non-medical: Not on file  Occupational History  . Not on file  Tobacco Use  . Smoking status: Current Some Day Smoker    Packs/day: 0.50    Years: 35.00    Pack years: 17.50    Types: Cigarettes  . Smokeless tobacco: Never Used  Substance and Sexual Activity  . Alcohol use: Yes    Alcohol/week: 0.0 oz    Comment: rare  . Drug use: No  . Sexual activity: Not Currently    Birth control/protection: Surgical  Other Topics Concern  . Not  on file  Social History Narrative  . Not on file    Allergies  Allergen Reactions  . Seroquel [Quetiapine Fumarate] Other (See Comments)    hallunications    Medications   Medication Sig Start Date End Date Taking? Authorizing Provider  albuterol (VENTOLIN HFA) 108 (90 Base) MCG/ACT inhaler TAKE TWO PUFFS EVERY 4 TO 6 HOURS AS NEEDED FOR SHORTNESS OF BREATH. 07/09/15  Yes Crissman, Jeannette How, MD  cholecalciferol (VITAMIN D) 1000 units tablet Take 1,000 Units by mouth daily.   Yes [provider]  omeprazole (PRILOSEC) 40 MG capsule Take 1 capsule (40 mg total) by mouth daily. 12/09/14  Yes Guadalupe Maple, MD  oxyCODONE (OXYCONTIN) 20 mg 12 hr tablet Take 20 mg by mouth every 12 (twelve) hours.   Yes [provider]   pregabalin (LYRICA) 50 MG capsule Take 50 mg by mouth 3 (three) times daily.   Yes [provider]  pseudoephedrine (SUDAFED 12 HOUR) 120 MG 12 hr tablet Sudafed   Yes [provider]  fluconazole (DIFLUCAN) 150 MG tablet as needed.  01/09/16   [provider]  furosemide (LASIX) 20 MG tablet Take 2 tablets (40 mg total) by mouth 2 (two) times daily. 04/11/16 07/10/16  Wende Bushy, MD  ibuprofen (ADVIL,MOTRIN) 800 MG tablet as needed.  03/09/16   [provider]  loperamide (IMODIUM) 2 MG capsule Take by mouth as needed.     [provider]  nortriptyline (PAMELOR) 10 MG capsule nortriptyline 10 mg capsule  1 to 2 capsules qhs prn for HA and pain    [provider]  omeprazole (PRILOSEC) 40 MG capsule omeprazole 40 mg capsule,delayed release    [provider]  OXYCODONE ER PO Take by mouth.    [provider]  potassium chloride SA (K-DUR,KLOR-CON) 20 MEQ tablet Take 40 mEq by mouth 2 (two) times daily.     [provider]    Review of Systems  Constitutional: Negative.   HENT: Negative.   Eyes: Negative.   Respiratory: Negative.   Cardiovascular: Negative.   Gastrointestinal: Negative.   Genitourinary: Negative.   Musculoskeletal: Negative.   Skin: Negative.   Neurological: Negative.   Psychiatric/Behavioral: Negative.      Physical Exam BP 128/88   Ht 5\' 2"  (1.575 m)   Wt 214 lb (97.1 kg)   BMI 39.14 kg/m  No LMP recorded. Patient has had a hysterectomy. Physical Exam  Constitutional: She is oriented to person, place, and time. She appears well-developed and well-nourished. No distress.  Genitourinary: Vagina normal. Pelvic exam was performed with patient supine. There is no rash, tenderness or lesion on the right labia. There is no rash, tenderness or lesion on the left labia. Vagina exhibits no lesion. No bleeding in the vagina. No signs of injury around the vagina. No vaginal discharge found.  Right adnexum does not display mass, does not display tenderness and does not display fullness.  Left adnexum displays fullness. Left adnexum does not display mass and does not display tenderness.  Genitourinary Comments: Uterus and cervix surgically absent  HENT:  Head: Normocephalic and atraumatic.  Eyes: EOM are normal. No scleral icterus.  Neck: Normal range of motion. Neck supple.  Cardiovascular: Normal rate and regular rhythm.  Pulmonary/Chest: Effort normal and breath sounds normal. No respiratory distress.  Abdominal: Soft. Bowel sounds are normal. She exhibits no distension and no mass. There is no tenderness. There is no rebound and no guarding.  Multiple well-healed surgical incision  scars  Musculoskeletal: Normal range of motion. She exhibits no edema.  Neurological: She is alert and oriented to person, place, and time. No cranial nerve deficit.  Skin: Skin is warm and dry. No erythema.  Psychiatric: She has a normal mood and affect. Her behavior is normal. Judgment normal.    Female chaperone present for pelvic and breast  portions of the physical exam  Assessment: 58 y.o. [id number] female here for  1. Ovarian mass, left      Plan: Problem List Items Addressed This Visit    None    Visit Diagnoses    Ovarian mass, left    -  Primary   Relevant Orders   US PELVIS TRANSVANGINAL NON-OB (TV ONLY)     New problem. Will initiate workup with pelvic ultrasound.  If concerning, will discuss need for surgical removal.    Prentice Docker, MD 08/01/2017 2:07 PM     CC: Joylene Draft, NP [address] [place], [place] [address]

## 2017-08-09 ENCOUNTER — Ambulatory Visit (INDEPENDENT_AMBULATORY_CARE_PROVIDER_SITE_OTHER): Payer: Medicaid Other

## 2017-08-09 ENCOUNTER — Encounter: Payer: Self-pay | Admitting: Obstetrics and Gynecology

## 2017-08-09 ENCOUNTER — Ambulatory Visit (INDEPENDENT_AMBULATORY_CARE_PROVIDER_SITE_OTHER): Payer: Medicaid Other | Admitting: Obstetrics and Gynecology

## 2017-08-09 VITALS — BP 128/84 | Ht 62.0 in | Wt 214.0 lb

## 2017-08-09 DIAGNOSIS — N83202 Unspecified ovarian cyst, left side: Secondary | ICD-10-CM | POA: Insufficient documentation

## 2017-08-09 DIAGNOSIS — N839 Noninflammatory disorder of ovary, fallopian tube and broad ligament, unspecified: Secondary | ICD-10-CM

## 2017-08-09 DIAGNOSIS — N838 Other noninflammatory disorders of ovary, fallopian tube and broad ligament: Secondary | ICD-10-CM

## 2017-08-09 NOTE — H&P (View-Only) (Signed)
Gynecology Ultrasound Follow Up   Chief Complaint  Patient presents with  . Follow-up  left ovarian cyst noted on MRI in October, 2018   History of Present Illness: Patient is a 57 y.o. female who presents today for ultrasound evaluation of the above .  Ultrasound demonstrates the following findings Adnexa: left ovary with cystic lesion measuring,  3.2 x 3.6 x 2.7 cm. There is a papillary projection measuring 7.6 mm (by my measurement).  There is no doppler flow within the mass, though there is some shadowing noted along this projection.  Uterus: surgically absent  Additional:  No free fluid in the pelvis  Past Medical History:  Diagnosis Date  . Anxiety   . Arthritis   . Cancer ([place]) 08/30/13   Lung, tx removal of upper left lobe and chemotherapy  . Chronic pain    pt states from lung surgery and knee pain  . GERD (gastroesophageal reflux disease)   . Insomnia   . 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 Skin cancer     Past Surgical History:  Procedure Laterality Date  . ABDOMINAL HYSTERECTOMY    . CESAREAN SECTION    . CHOLECYSTECTOMY  01-02-14  . COLONOSCOPY  05-13-14   Dr Bary Castilla  . left shoulder blade Left 30 years ago   removed extra bone  . NASAL TURBINATE REDUCTION Bilateral 01/08/2016   Procedure: TURBINATE REDUCTION/SUBMUCOSAL RESECTION;  Surgeon: [name], MD;  Location: [facility];  Service: ENT;  Laterality: Bilateral;  . SEPTOPLASTY Bilateral 01/08/2016   Procedure: SEPTOPLASTY;  Surgeon: [name], MD;  Location: [facility];  Service: ENT;  Laterality: Bilateral;  . upper lobectomy Left     Family History  Problem Relation Age of Onset  . Thyroid cancer Mother   . Heart attack Father   . Thyroid cancer Brother     Social History   Socioeconomic History  . Marital status: Married    Spouse name: Not on file  . Number of children: Not on file  .  Years of education: Not on file  . Highest education level: Not on file  Social Needs  . Financial resource strain: Not on file  . Food insecurity - worry: Not on file  . Food insecurity - inability: Not on file  . Transportation needs - medical: Not on file  . Transportation needs - non-medical: Not on file  Occupational History  . Not on file  Tobacco Use  . Smoking status: Current Some Day Smoker    Packs/day: 0.50    Years: 35.00    Pack years: 17.50    Types: Cigarettes  . Smokeless tobacco: Never Used  Substance and Sexual Activity  . Alcohol use: Yes    Alcohol/week: 0.0 oz    Comment: rare  . Drug use: No  . Sexual activity: Not Currently    Birth control/protection: Surgical  Other Topics Concern  . Not on file  Social History Narrative  . Not on file    Allergies  Allergen Reactions  . Seroquel [Quetiapine Fumarate] Other (See Comments)    hallunications    Prior to Admission medications   Medication Sig Start Date End Date Taking? Authorizing Provider  albuterol (VENTOLIN HFA) 108 (90 Base) MCG/ACT inhaler TAKE TWO PUFFS EVERY 4 TO 6 HOURS AS NEEDED FOR SHORTNESS OF BREATH. 07/09/15   Guadalupe Maple, MD  cholecalciferol (VITAMIN D) 1000 units tablet Take 1,000  Units by mouth daily.    [provider]  fluconazole (DIFLUCAN) 150 MG tablet as needed.  01/09/16   [provider]  furosemide (LASIX) 20 MG tablet Take 2 tablets (40 mg total) by mouth 2 (two) times daily. 04/11/16 07/10/16  Wende Bushy, MD  ibuprofen (ADVIL,MOTRIN) 800 MG tablet as needed.  03/09/16   [provider]  loperamide (IMODIUM) 2 MG capsule Take by mouth as needed.     [provider]  nortriptyline (PAMELOR) 10 MG capsule nortriptyline 10 mg capsule  1 to 2 capsules qhs prn for HA and pain    [provider]  omeprazole (PRILOSEC) 40 MG capsule Take 1 capsule (40 mg total) by mouth daily. 12/09/14   Guadalupe Maple, MD  omeprazole (PRILOSEC)  40 MG capsule omeprazole 40 mg capsule,delayed release    [provider]  oxyCODONE (OXYCONTIN) 20 mg 12 hr tablet Take 20 mg by mouth every 12 (twelve) hours.    [provider]  OXYCODONE ER PO Take by mouth.    [provider]  potassium chloride SA (K-DUR,KLOR-CON) 20 MEQ tablet Take 40 mEq by mouth 2 (two) times daily.     [provider]  pregabalin (LYRICA) 50 MG capsule Take 50 mg by mouth 3 (three) times daily.    [provider]  pseudoephedrine (SUDAFED 12 HOUR) 120 MG 12 hr tablet Sudafed    [provider]  rizatriptan (MAXALT) 10 MG tablet Take 1 tablet (10 mg total) by mouth as needed for migraine. May repeat in 2 hours if needed Patient not taking: Reported on 08/01/2017 10/05/15   Guadalupe Maple, MD    Physical Exam BP 128/84   Ht 5\' 2"  (1.575 m)   Wt 214 lb (97.1 kg)   BMI 39.14 kg/m    General: NAD HEENT: normocephalic, anicteric Pulmonary: No increased work of breathing Extremities: no edema, erythema, or tenderness Neurologic: Grossly intact, normal gait Psychiatric: mood appropriate, affect full   Assessment: 57 y.o. [id number]  1. Left ovarian cyst      Plan: Problem List Items Addressed This Visit      Genitourinary   Left ovarian cyst - Primary   Relevant Orders   Ovarian Malignancy Risk-ROMA     IOTA LR2 score 0.3%.   Will obtain ROMA score.  We first discussed that I cannot tell her for sure what this lesion is without removing it.  I calculated her Iota LR2 score as 0.3%, which is reassuring. We discussed the strengths and limitations of this prediction model.  In addition, I obtained a ROMA lab to assess concern for malignancy.  If this is reassuring, the option to continue to monitor is available.  However, in either case, I offered to remove the lesion as it has been present since October.  She would like to await her Roma score and we will make a decision together from there.  15 minutes  spent in face to face discussion with > 50% spent in counseling,management, and coordination of care of her left ovarian mass.  Prentice Docker, MD, [facility], [facility] 08/09/2017 1:34 PM    CC: Joylene Draft, NP [address] [place], [place] [phone]

## 2017-08-09 NOTE — Progress Notes (Signed)
Gynecology Ultrasound Follow Up   Chief Complaint  Patient presents with  . Follow-up  left ovarian cyst noted on MRI in October, 2018   History of Present Illness: Patient is a 57 y.o. female who presents today for ultrasound evaluation of the above .  Ultrasound demonstrates the following findings Adnexa: left ovary with cystic lesion measuring,  3.2 x 3.6 x 2.7 cm. There is a papillary projection measuring 7.6 mm (by my measurement).  There is no doppler flow within the mass, though there is some shadowing noted along this projection.  Uterus: surgically absent  Additional:  No free fluid in the pelvis  Past Medical History:  Diagnosis Date  . Anxiety   . Arthritis   . Cancer ([place]) 08/30/13   Lung, tx removal of upper left lobe and chemotherapy  . Chronic pain    pt states from lung surgery and knee pain  . GERD (gastroesophageal reflux disease)   . Insomnia   . 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 Skin cancer     Past Surgical History:  Procedure Laterality Date  . ABDOMINAL HYSTERECTOMY    . CESAREAN SECTION    . CHOLECYSTECTOMY  01-02-14  . COLONOSCOPY  05-13-14   Dr Bary Castilla  . left shoulder blade Left 30 years ago   removed extra bone  . NASAL TURBINATE REDUCTION Bilateral 01/08/2016   Procedure: TURBINATE REDUCTION/SUBMUCOSAL RESECTION;  Surgeon: [name], MD;  Location: [facility];  Service: ENT;  Laterality: Bilateral;  . SEPTOPLASTY Bilateral 01/08/2016   Procedure: SEPTOPLASTY;  Surgeon: [name], MD;  Location: [facility];  Service: ENT;  Laterality: Bilateral;  . upper lobectomy Left     Family History  Problem Relation Age of Onset  . Thyroid cancer Mother   . Heart attack Father   . Thyroid cancer Brother     Social History   Socioeconomic History  . Marital status: Married    Spouse name: Not on file  . Number of children: Not on file  .  Years of education: Not on file  . Highest education level: Not on file  Social Needs  . Financial resource strain: Not on file  . Food insecurity - worry: Not on file  . Food insecurity - inability: Not on file  . Transportation needs - medical: Not on file  . Transportation needs - non-medical: Not on file  Occupational History  . Not on file  Tobacco Use  . Smoking status: Current Some Day Smoker    Packs/day: 0.50    Years: 35.00    Pack years: 17.50    Types: Cigarettes  . Smokeless tobacco: Never Used  Substance and Sexual Activity  . Alcohol use: Yes    Alcohol/week: 0.0 oz    Comment: rare  . Drug use: No  . Sexual activity: Not Currently    Birth control/protection: Surgical  Other Topics Concern  . Not on file  Social History Narrative  . Not on file    Allergies  Allergen Reactions  . Seroquel [Quetiapine Fumarate] Other (See Comments)    hallunications    Prior to Admission medications   Medication Sig Start Date End Date Taking? Authorizing Provider  albuterol (VENTOLIN HFA) 108 (90 Base) MCG/ACT inhaler TAKE TWO PUFFS EVERY 4 TO 6 HOURS AS NEEDED FOR SHORTNESS OF BREATH. 07/09/15   Guadalupe Maple, MD  cholecalciferol (VITAMIN D) 1000 units tablet Take 1,000  Units by mouth daily.    [provider]  fluconazole (DIFLUCAN) 150 MG tablet as needed.  01/09/16   [provider]  furosemide (LASIX) 20 MG tablet Take 2 tablets (40 mg total) by mouth 2 (two) times daily. 04/11/16 07/10/16  Wende Bushy, MD  ibuprofen (ADVIL,MOTRIN) 800 MG tablet as needed.  03/09/16   [provider]  loperamide (IMODIUM) 2 MG capsule Take by mouth as needed.     [provider]  nortriptyline (PAMELOR) 10 MG capsule nortriptyline 10 mg capsule  1 to 2 capsules qhs prn for HA and pain    [provider]  omeprazole (PRILOSEC) 40 MG capsule Take 1 capsule (40 mg total) by mouth daily. 12/09/14   Guadalupe Maple, MD  omeprazole (PRILOSEC)  40 MG capsule omeprazole 40 mg capsule,delayed release    [provider]  oxyCODONE (OXYCONTIN) 20 mg 12 hr tablet Take 20 mg by mouth every 12 (twelve) hours.    [provider]  OXYCODONE ER PO Take by mouth.    [provider]  potassium chloride SA (K-DUR,KLOR-CON) 20 MEQ tablet Take 40 mEq by mouth 2 (two) times daily.     [provider]  pregabalin (LYRICA) 50 MG capsule Take 50 mg by mouth 3 (three) times daily.    [provider]  pseudoephedrine (SUDAFED 12 HOUR) 120 MG 12 hr tablet Sudafed    [provider]  rizatriptan (MAXALT) 10 MG tablet Take 1 tablet (10 mg total) by mouth as needed for migraine. May repeat in 2 hours if needed Patient not taking: Reported on 08/01/2017 10/05/15   Guadalupe Maple, MD    Physical Exam BP 128/84   Ht 5\' 2"  (1.575 m)   Wt 214 lb (97.1 kg)   BMI 39.14 kg/m    General: NAD HEENT: normocephalic, anicteric Pulmonary: No increased work of breathing Extremities: no edema, erythema, or tenderness Neurologic: Grossly intact, normal gait Psychiatric: mood appropriate, affect full   Assessment: 57 y.o. [id number]  1. Left ovarian cyst      Plan: Problem List Items Addressed This Visit      Genitourinary   Left ovarian cyst - Primary   Relevant Orders   Ovarian Malignancy Risk-ROMA     IOTA LR2 score 0.3%.   Will obtain ROMA score.  We first discussed that I cannot tell her for sure what this lesion is without removing it.  I calculated her Iota LR2 score as 0.3%, which is reassuring. We discussed the strengths and limitations of this prediction model.  In addition, I obtained a ROMA lab to assess concern for malignancy.  If this is reassuring, the option to continue to monitor is available.  However, in either case, I offered to remove the lesion as it has been present since October.  She would like to await her Roma score and we will make a decision together from there.  15 minutes  spent in face to face discussion with > 50% spent in counseling,management, and coordination of care of her left ovarian mass.  Prentice Docker, MD, [facility], [facility] 08/09/2017 1:34 PM    CC: Joylene Draft, NP [address] [place], [place] [address]

## 2017-08-11 LAB — OVARIAN MALIGNANCY RISK-ROMA
CANCER ANTIGEN (CA) 125: 12.5 U/mL (ref 0.0–38.1)
HE4: 65.8 pmol/L (ref 0.0–105.2)
POSTMENOPAUSAL ROMA: 1.32
PREMENOPAUSAL ROMA: 1.33 — AB

## 2017-08-11 LAB — POSTMENOPAUSAL INTERP: LOW

## 2017-08-11 LAB — PREMENOPAUSAL INTERP: HIGH

## 2017-08-17 ENCOUNTER — Telehealth: Payer: Self-pay

## 2017-08-17 NOTE — Telephone Encounter (Signed)
Pt called to speak with Dr. Glennon Mac about test results.

## 2017-08-18 ENCOUNTER — Telehealth: Payer: Self-pay | Admitting: Obstetrics and Gynecology

## 2017-08-18 NOTE — Telephone Encounter (Signed)
Patient is aware of OR on 08/22/17, and that Pre-admit Testing will either have her come in on Monday, or early for her surgery on Tuesday.

## 2017-08-18 NOTE — Telephone Encounter (Signed)
Discussed results from ROMA test, indicating that she is low-risk for malignancy.   She would like to proceed with surgery to remove. I also discussed monitoring the ovary over the next 3 months as a reasonable alternative given the low pre-test probability of malignancy. Will schedule her for bilateral salpingo-oophorectomy at next availability.  Prentice Docker, MD, [facility], [facility] 08/18/2017 11:41 AM

## 2017-08-18 NOTE — Telephone Encounter (Signed)
-----   Message from Will Bonnet, MD sent at 08/18/2017 11:41 AM EDT ----- Regarding: Schedule surgery Surgery Booking Request Patient Full Name:  Natalie Stark  MRN: [id number]  DOB: 01-29-1961  Surgeon: Prentice Docker, MD  Requested Surgery Date and Time: soon, when I have another MD in OR with me Primary Diagnosis AND Code: left ovarian cyst Secondary Diagnosis and Code:  Surgical Procedure: laparoscopic bilateral salpingo-oophorectomy L&D Notification: No Admission Status: same day surgery Length of Surgery: 1 hour Special Case Needs: none H&P: TBD (date) Phone Interview???: no Interpreter: Language:  Medical Clearance: no Special Scheduling Instructions: none

## 2017-08-22 ENCOUNTER — Encounter
Admission: RE | Disposition: A | Discharge status: Home / Self Care | Payer: Self-pay | Source: Ambulatory Visit | Attending: Obstetrics and Gynecology

## 2017-08-22 ENCOUNTER — Ambulatory Visit: Payer: Medicaid Other | Admitting: Registered Nurse

## 2017-08-22 ENCOUNTER — Other Ambulatory Visit: Payer: Self-pay

## 2017-08-22 ENCOUNTER — Ambulatory Visit
Admission: RE | Admit: 2017-08-22 | Discharge: 2017-08-22 | Disposition: A | Discharge status: Home / Self Care | Payer: Medicaid Other | Source: Ambulatory Visit | Attending: Obstetrics and Gynecology | Admitting: Obstetrics and Gynecology

## 2017-08-22 DIAGNOSIS — Z902 Acquired absence of lung [part of]: Secondary | ICD-10-CM | POA: Diagnosis not present

## 2017-08-22 DIAGNOSIS — I340 Nonrheumatic mitral (valve) insufficiency: Secondary | ICD-10-CM | POA: Insufficient documentation

## 2017-08-22 DIAGNOSIS — N83202 Unspecified ovarian cyst, left side: Secondary | ICD-10-CM | POA: Diagnosis present

## 2017-08-22 DIAGNOSIS — Z79891 Long term (current) use of opiate analgesic: Secondary | ICD-10-CM | POA: Diagnosis not present

## 2017-08-22 DIAGNOSIS — Z79899 Other long term (current) drug therapy: Secondary | ICD-10-CM | POA: Insufficient documentation

## 2017-08-22 DIAGNOSIS — Z85828 Personal history of other malignant neoplasm of skin: Secondary | ICD-10-CM | POA: Insufficient documentation

## 2017-08-22 DIAGNOSIS — K219 Gastro-esophageal reflux disease without esophagitis: Secondary | ICD-10-CM | POA: Diagnosis not present

## 2017-08-22 DIAGNOSIS — G8929 Other chronic pain: Secondary | ICD-10-CM | POA: Diagnosis not present

## 2017-08-22 DIAGNOSIS — F17210 Nicotine dependence, cigarettes, uncomplicated: Secondary | ICD-10-CM | POA: Insufficient documentation

## 2017-08-22 DIAGNOSIS — D271 Benign neoplasm of left ovary: Secondary | ICD-10-CM | POA: Insufficient documentation

## 2017-08-22 DIAGNOSIS — Z85118 Personal history of other malignant neoplasm of bronchus and lung: Secondary | ICD-10-CM | POA: Diagnosis not present

## 2017-08-22 DIAGNOSIS — Z9221 Personal history of antineoplastic chemotherapy: Secondary | ICD-10-CM | POA: Diagnosis not present

## 2017-08-22 DIAGNOSIS — I10 Essential (primary) hypertension: Secondary | ICD-10-CM | POA: Diagnosis not present

## 2017-08-22 DIAGNOSIS — N736 Female pelvic peritoneal adhesions (postinfective): Secondary | ICD-10-CM | POA: Diagnosis not present

## 2017-08-22 HISTORY — PX: CYSTOSCOPY: SHX5120

## 2017-08-22 HISTORY — PX: LAPAROSCOPIC SALPINGO OOPHERECTOMY: SHX5927

## 2017-08-22 LAB — TYPE AND SCREEN
ABO/RH(D): B POS
Antibody Screen: NEGATIVE

## 2017-08-22 LAB — ABO/RH: ABO/RH(D): B POS

## 2017-08-22 SURGERY — SALPINGO-OOPHORECTOMY, LAPAROSCOPIC
Anesthesia: General | Site: Ureter | Laterality: Left | Wound class: Clean Contaminated

## 2017-08-22 MED ORDER — MIDAZOLAM HCL 2 MG/2ML IJ SOLN
INTRAMUSCULAR | Status: DC | PRN
  Administered 2017-08-22: 2 mg via INTRAVENOUS

## 2017-08-22 MED ORDER — DEXAMETHASONE SODIUM PHOSPHATE 10 MG/ML IJ SOLN
INTRAMUSCULAR | Status: DC | PRN
  Administered 2017-08-22: 5 mg via INTRAVENOUS

## 2017-08-22 MED ORDER — HYDROMORPHONE HCL 1 MG/ML IJ SOLN: 0.2500 mg | INTRAMUSCULAR | Status: DC | PRN

## 2017-08-22 MED ORDER — FENTANYL CITRATE (PF) 250 MCG/5ML IJ SOLN
INTRAMUSCULAR | Status: AC
  Filled 2017-08-22: qty 5

## 2017-08-22 MED ORDER — ROCURONIUM BROMIDE 50 MG/5ML IV SOLN
INTRAVENOUS | Status: AC
  Filled 2017-08-22: qty 1

## 2017-08-22 MED ORDER — LACTATED RINGERS IV SOLN
INTRAVENOUS | Status: DC
  Administered 2017-08-22: 12:00:00 via INTRAVENOUS

## 2017-08-22 MED ORDER — FENTANYL CITRATE (PF) 100 MCG/2ML IJ SOLN
INTRAMUSCULAR | Status: DC | PRN
  Administered 2017-08-22: 50 ug via INTRAVENOUS
  Administered 2017-08-22: 100 ug via INTRAVENOUS
  Administered 2017-08-22 (×2): 50 ug via INTRAVENOUS
  Administered 2017-08-22: 100 ug via INTRAVENOUS

## 2017-08-22 MED ORDER — LIDOCAINE HCL (CARDIAC) 20 MG/ML IV SOLN
INTRAVENOUS | Status: DC | PRN
  Administered 2017-08-22: 100 mg via INTRAVENOUS

## 2017-08-22 MED ORDER — SUGAMMADEX SODIUM 200 MG/2ML IV SOLN
INTRAVENOUS | Status: DC | PRN
  Administered 2017-08-22: 200 mg via INTRAVENOUS

## 2017-08-22 MED ORDER — OXYCODONE HCL 5 MG PO TABS: 5.0000 mg | ORAL_TABLET | Freq: Four times a day (QID) | ORAL | 0 refills | Status: DC | PRN

## 2017-08-22 MED ORDER — ONDANSETRON HCL 4 MG/2ML IJ SOLN
INTRAMUSCULAR | Status: DC | PRN
  Administered 2017-08-22: 4 mg via INTRAVENOUS

## 2017-08-22 MED ORDER — MIDAZOLAM HCL 2 MG/2ML IJ SOLN
INTRAMUSCULAR | Status: AC
  Filled 2017-08-22: qty 2

## 2017-08-22 MED ORDER — LIDOCAINE HCL (PF) 2 % IJ SOLN
INTRAMUSCULAR | Status: AC
  Filled 2017-08-22: qty 10

## 2017-08-22 MED ORDER — PROMETHAZINE HCL 25 MG/ML IJ SOLN
6.2500 mg | INTRAMUSCULAR | Status: DC | PRN
Start: 2017-08-22 — End: 2017-08-22

## 2017-08-22 MED ORDER — PROPOFOL 10 MG/ML IV BOLUS
INTRAVENOUS | Status: AC
  Filled 2017-08-22: qty 20

## 2017-08-22 MED ORDER — HYDROCODONE-ACETAMINOPHEN 7.5-325 MG PO TABS
1.0000 | ORAL_TABLET | Freq: Once | ORAL | Status: AC | PRN
  Administered 2017-08-22: 1 via ORAL

## 2017-08-22 MED ORDER — BUPIVACAINE HCL (PF) 0.5 % IJ SOLN
INTRAMUSCULAR | Status: DC | PRN
  Administered 2017-08-22: 13 mL

## 2017-08-22 MED ORDER — IBUPROFEN 600 MG PO TABS: 600.0000 mg | ORAL_TABLET | Freq: Four times a day (QID) | ORAL | 0 refills | Status: AC | PRN

## 2017-08-22 MED ORDER — BUPIVACAINE HCL (PF) 0.5 % IJ SOLN
INTRAMUSCULAR | Status: AC
  Filled 2017-08-22: qty 30

## 2017-08-22 MED ORDER — ROCURONIUM BROMIDE 100 MG/10ML IV SOLN
INTRAVENOUS | Status: DC | PRN
  Administered 2017-08-22: 40 mg via INTRAVENOUS
  Administered 2017-08-22: 10 mg via INTRAVENOUS

## 2017-08-22 MED ORDER — SEVOFLURANE IN SOLN
RESPIRATORY_TRACT | Status: AC
  Filled 2017-08-22: qty 250

## 2017-08-22 MED ORDER — PROPOFOL 10 MG/ML IV BOLUS
INTRAVENOUS | Status: DC | PRN
  Administered 2017-08-22: 170 mg via INTRAVENOUS

## 2017-08-22 MED ORDER — HYDROCODONE-ACETAMINOPHEN 7.5-325 MG PO TABS
ORAL_TABLET | ORAL | Status: AC
  Filled 2017-08-22: qty 1

## 2017-08-22 MED ORDER — ONDANSETRON HCL 4 MG/2ML IJ SOLN
INTRAMUSCULAR | Status: AC
  Filled 2017-08-22: qty 2

## 2017-08-22 MED ORDER — FLUORESCEIN SODIUM 10 % IV SOLN
INTRAVENOUS | Status: DC | PRN
  Administered 2017-08-22: .5 mL via INTRAVENOUS

## 2017-08-22 MED ORDER — FLUORESCEIN SODIUM 10 % IV SOLN
INTRAVENOUS | Status: AC
  Filled 2017-08-22: qty 5

## 2017-08-22 MED ORDER — MEPERIDINE HCL 50 MG/ML IJ SOLN: 6.2500 mg | INTRAMUSCULAR | Status: DC | PRN

## 2017-08-22 MED ORDER — SUCCINYLCHOLINE CHLORIDE 20 MG/ML IJ SOLN
INTRAMUSCULAR | Status: DC | PRN
  Administered 2017-08-22: 100 mg via INTRAVENOUS

## 2017-08-22 MED ORDER — FENTANYL CITRATE (PF) 100 MCG/2ML IJ SOLN
INTRAMUSCULAR | Status: AC
  Filled 2017-08-22: qty 2

## 2017-08-22 SURGICAL SUPPLY — 50 items
ANCHOR TIS RET SYS 235ML (MISCELLANEOUS) ×4 IMPLANT
BAG URINE DRAINAGE (UROLOGICAL SUPPLIES) ×4 IMPLANT
BLADE SURG SZ11 CARB STEEL (BLADE) ×4 IMPLANT
CANISTER SUCT 1200ML W/VALVE (MISCELLANEOUS) ×4 IMPLANT
CATH FOL 2WAY LX 16X5 (CATHETERS) ×4 IMPLANT
CHLORAPREP W/TINT 26ML (MISCELLANEOUS) ×4 IMPLANT
CNTNR SPEC 2.5X3XGRAD LEK (MISCELLANEOUS) ×4
CONT SPEC 4OZ STER OR WHT (MISCELLANEOUS) ×4
CONTAINER SPEC 2.5X3XGRAD LEK (MISCELLANEOUS) ×4 IMPLANT
DERMABOND ADVANCED (GAUZE/BANDAGES/DRESSINGS) ×2
DERMABOND ADVANCED .7 DNX12 (GAUZE/BANDAGES/DRESSINGS) ×2 IMPLANT
DRAPE LEGGINS SURG 28X43 STRL (DRAPES) ×4 IMPLANT
DRAPE SHEET LG 3/4 BI-LAMINATE (DRAPES) ×4 IMPLANT
DRAPE UNDER BUTTOCK W/FLU (DRAPES) ×4 IMPLANT
DRSG TELFA 4X3 1S NADH ST (GAUZE/BANDAGES/DRESSINGS) ×4 IMPLANT
GLOVE BIO SURGEON STRL SZ7 (GLOVE) ×4 IMPLANT
GLOVE BIOGEL PI IND STRL 7.5 (GLOVE) ×2 IMPLANT
GLOVE BIOGEL PI INDICATOR 7.5 (GLOVE) ×2
GOWN STRL REUS W/ TWL LRG LVL3 (GOWN DISPOSABLE) ×6 IMPLANT
GOWN STRL REUS W/TWL LRG LVL3 (GOWN DISPOSABLE) ×6
GRASPER SUT TROCAR 14GX15 (MISCELLANEOUS) ×4 IMPLANT
IRRIGATION STRYKERFLOW (MISCELLANEOUS) ×2 IMPLANT
IRRIGATOR STRYKERFLOW (MISCELLANEOUS) ×4
IV LACTATED RINGERS 1000ML (IV SOLUTION) ×4 IMPLANT
KIT PINK PAD W/HEAD ARE REST (MISCELLANEOUS) ×4
KIT PINK PAD W/HEAD ARM REST (MISCELLANEOUS) ×2 IMPLANT
KIT TURNOVER CYSTO (KITS) ×4 IMPLANT
LABEL OR SOLS (LABEL) ×4 IMPLANT
LIGASURE VESSEL 5MM BLUNT TIP (ELECTROSURGICAL) IMPLANT
MANIPULATOR UTERINE 4.5 ZUMI (MISCELLANEOUS) ×4 IMPLANT
NEEDLE HYPO 22GX1.5 SAFETY (NEEDLE) ×4 IMPLANT
NS IRRIG 500ML POUR BTL (IV SOLUTION) ×4 IMPLANT
PACK LAP CHOLECYSTECTOMY (MISCELLANEOUS) ×4 IMPLANT
PAD OB MATERNITY 4.3X12.25 (PERSONAL CARE ITEMS) ×4 IMPLANT
PAD PREP 24X41 OB/GYN DISP (PERSONAL CARE ITEMS) ×4 IMPLANT
SCISSORS METZENBAUM CVD 33 (INSTRUMENTS) IMPLANT
SET CYSTO W/LG BORE CLAMP LF (SET/KITS/TRAYS/PACK) ×4 IMPLANT
SLEEVE ENDOPATH XCEL 5M (ENDOMECHANICALS) ×4 IMPLANT
SOL PREP PVP 2OZ (MISCELLANEOUS) ×4
SOLUTION PREP PVP 2OZ (MISCELLANEOUS) ×2 IMPLANT
SURGILUBE 2OZ TUBE FLIPTOP (MISCELLANEOUS) ×4 IMPLANT
SUT MNCRL 4-0 (SUTURE) ×2
SUT MNCRL 4-0 27XMFL (SUTURE) ×2
SUT VIC AB 0 CT1 36 (SUTURE) ×4 IMPLANT
SUT VIC AB 2-0 UR6 27 (SUTURE) IMPLANT
SUTURE MNCRL 4-0 27XMF (SUTURE) ×2 IMPLANT
SYR 50ML LL SCALE MARK (SYRINGE) ×4 IMPLANT
TROCAR ENDO BLADELESS 11MM (ENDOMECHANICALS) ×4 IMPLANT
TROCAR XCEL NON-BLD 5MMX100MML (ENDOMECHANICALS) ×4 IMPLANT
TUBING INSUFFLATION (TUBING) ×4 IMPLANT

## 2017-08-22 NOTE — Anesthesia Postprocedure Evaluation (Signed)
Anesthesia Post Note  Patient: Natalie Stark  Procedure(s) Performed: LAPAROSCOPIC SALPINGO OOPHORECTOMY (Left Abdomen) CYSTOSCOPY (Bilateral Ureter)  Patient location during evaluation: PACU Anesthesia Type: General Level of consciousness: awake and alert and oriented Pain management: pain level controlled Vital Signs Assessment: post-procedure vital signs reviewed and stable Respiratory status: spontaneous breathing Cardiovascular status: blood pressure returned to baseline Anesthetic complications: no     Last Vitals:  Vitals:   08/22/17 1642 08/22/17 1718  BP: (!) 143/79 (!) 158/76  Pulse: 71 83  Resp: 16 18  Temp: (!) 36.2 C   SpO2: 97% 98%    Last Pain:  Vitals:   08/22/17 1718  TempSrc:   PainSc: 4                  Hallie Ishida

## 2017-08-22 NOTE — Anesthesia Procedure Notes (Signed)
Procedure Name: Intubation Date/Time: 08/22/2017 2:20 PM Performed by: Eben Burow, CRNA Pre-anesthesia Checklist: Patient identified, Emergency Drugs available, Suction available, Patient being monitored and Timeout performed Patient Re-evaluated:Patient Re-evaluated prior to induction Oxygen Delivery Method: Circle system utilized Preoxygenation: Pre-oxygenation with 100% oxygen Induction Type: IV induction Ventilation: Mask ventilation without difficulty Laryngoscope Size: Mac and 3 Grade View: Grade I Tube type: Oral Tube size: 7.0 mm Number of attempts: 1 Airway Equipment and Method: Stylet Placement Confirmation: ETT inserted through vocal cords under direct vision,  positive ETCO2 and breath sounds checked- equal and bilateral Secured at: 21 cm Tube secured with: Tape Dental Injury: Teeth and Oropharynx as per pre-operative assessment

## 2017-08-22 NOTE — Transfer of Care (Signed)
Immediate Anesthesia Transfer of Care Note  Patient: Natalie Stark  Procedure(s) Performed: LAPAROSCOPIC SALPINGO OOPHORECTOMY (Left Abdomen) CYSTOSCOPY (Bilateral Ureter)  Patient Location: PACU  Anesthesia Type:General  Level of Consciousness: sedated  Airway & Oxygen Therapy: Patient connected to face mask oxygen  Post-op Assessment: Post -op Vital signs reviewed and stable  Post vital signs: stable  Last Vitals:  Vitals Value Taken Time  BP 139/63 08/22/2017  4:05 PM  Temp 37.2 C 08/22/2017  4:04 PM  Pulse 94 08/22/2017  4:06 PM  Resp 20 08/22/2017  4:06 PM  SpO2 95 % 08/22/2017  4:06 PM  Vitals shown include unvalidated device data.  Last Pain:  Vitals:   08/22/17 1604  TempSrc: Temporal         Complications: No apparent anesthesia complications

## 2017-08-22 NOTE — Op Note (Signed)
Operative Note    Pre-Op Diagnosis: persistent left ovarian cyst  Post-Op Diagnosis: persistent left ovarian cyst  Procedures:  1) Laparoscopic left salpingo-oophorectomy 2) removal of left ovarian cyst 3) cystoscopy  Primary Surgeon: Prentice Docker, MD   Assistant Surgeon: Barnett Applebaum, MD  EBL: 10 mL   IVF: 1,000 mL crystalloid  Urine output: 100 mL  Specimens:  1) left fallopian tube and ovary 3) left ovarian cyst 4) pelvic washings  Drains: None.  Complications: None   Disposition: PACU   Condition: Stable   Findings:  1) left ovary with apparent cyst along epithelial margin 2) adhesions of left ovary to pelvic sidewall and bladder  Procedure Summary:  The patient was taken to the operating room where general anesthesia was administered and found to be adequate. She was placed in the dorsal supine lithotomy position in Halma stirrups and prepped and draped in usual sterile fashion. After a timeout was called an indwelling catheter was placed in her bladder.   Attention was turned to the abdomen where after injection of local anesthetic, a 5 mm infraumbilical incision was made with the scalpel. Entry into the abdomen was obtained via Optiview trocar technique (a blunt entry technique with camera visualization through the obturator upon entry). Verification of entry into the abdomen was obtained using opening pressures. The abdomen was insufflated with CO2. The camera was introduced through the trocar with verification of atraumatic entry.  A right lower quadrant 11 mm port was placed under direct intra-abdominal camera visualization without difficulty.  Similarly, a left lower quadrant 5 mm port was placed under direct intra-abdominal camera visualization without difficulty.  Pelvic washings were obtained at this point.  Attention was turned to the pelvis.  The left ovary and cyst were carefully dissected from the pelvic sidewall and bladder.  This freed the left  ovary, fallopian tube, and left infundibulopelvic ligament except for a small amount of adhesions at the pelvic brim, which were then taken down.  The left ureter was attempted to be visualized.  However, it was not visualized.  Therefore, the left infundibulopelvic ligament was taken close to the left ovary with care to remove the left fallopian tube as well.  The left fallopian tube and ovary along with the ovarian cyst were removed through the right lower quadrant port after placement of an Endo Catch bag.  Given that the left ureter was unable to be visualized and the density of adhesive disease, a cystoscopy was undertaken.  Attention was turned to the pelvis where the Foley was removed and a 70 degree cystoscope was gently introduced through the urethra.  The left ureteral orifice was monitored with equivocal expulsion of urine.  Therefore, fluorescein was administered intravenously.  Reflux of Fluorescein and urine was seen through the bilateral ureteral orifices.  The entire rest of the bladder was surveyed and found to be without defect or injury.  The cystoscope was removed and the catheter was replaced.  Attention was returned to the abdominal cavity where hemostasis was once again observed.  The right lower quadrant trocar was removed and using a fascial closure device the right lower quadrant fascia was reapproximated using 0 Vicryl with a single stitch.  The abdomen was desufflated of CO2.  The remaining trochars were removed.  The skin was closed at the right lower quadrant using 4-0 Monocryl in a subcuticular fashion.  The remaining two 5 mm ports were closed using a subcutaneous stitch.  The skin was closed at all three port sites  using surgical skin glue.  The Foley catheter was removed at the end of the case and the vagina was swept to ensure no instruments or sponges remained.  The patient tolerated the procedure well.  Sponge, lap, needle, and instrument counts were correct x 2.  VTE  prophylaxis: SCDs. Antibiotic prophylaxis: none indicated nor given. She was awakened in the operating room and was taken to the PACU in stable condition.   Prentice Docker, MD 08/22/2017 3:53 PM

## 2017-08-22 NOTE — Discharge Instructions (Signed)

## 2017-08-22 NOTE — Interval H&P Note (Signed)
History and Physical Interval Note:  08/22/2017 2:05 PM  LEVA BAINE  has presented today for surgery, with the diagnosis of LEFT OVARIAN CYST  The various methods of treatment have been discussed with the patient and family. After consideration of risks, benefits and other options for treatment, the patient has consented to  Procedure(s): LAPAROSCOPIC SALPINGO OOPHORECTOMY (Bilateral) as a surgical intervention .  The patient's history has been reviewed, patient examined, no change in status, stable for surgery.  I have reviewed the patient's chart and labs.  Questions were answered to the patient's satisfaction.  Patient states that she has had her right ovary removed.  If present will remove, along with fallopian tube remnant.    BP (!) 179/95   Pulse 89   Temp (!) 97.2 F (36.2 C) (Temporal)   Resp 16   Wt 214 lb (97.1 kg)   SpO2 99%   BMI 39.14 kg/m   Lungs clear to auscultationl CV: RRR  Prentice Docker, MD, [place], [facility] 08/22/2017 2:05 PM

## 2017-08-22 NOTE — Anesthesia Preprocedure Evaluation (Addendum)
Anesthesia Evaluation  Patient identified by MRN, date of birth, ID band Patient awake    Reviewed: Allergy & Precautions, H&P , NPO status , reviewed documented beta blocker date and time , Unable to perform ROS - Chart review only  Airway Mallampati: III  TM Distance: >3 FB     Dental  (+) Poor Dentition, Missing, Chipped   Pulmonary Current Smoker,    Pulmonary exam normal        Cardiovascular hypertension, Normal cardiovascular exam  2018 Study Conclusions  - Left ventricle: The cavity size was normal. Systolic function was   normal. The estimated ejection fraction was in the range of 55%   to 60%. Wall motion was normal; there were no regional wall   motion abnormalities. Left ventricular diastolic function   parameters were normal. - Mitral valve: There was mild regurgitation. - Left atrium: The atrium was normal in size. - Right ventricle: Systolic function was normal. - Pulmonary arteries: Systolic pressure was within the normal   range.   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 ABDOMINAL HYSTERECTOMY   . CESAREAN SECTION   . CHOLECYSTECTOMY  01-02-14 . COLONOSCOPY  05-13-14 . left shoulder blade Left 30 years ago  removed extra bone . NASAL TURBINATE REDUCTION . SEPTOPLASTY Bilateral 01/08/2016 . upper lobectomy Left  Reproductive/Obstetrics                             Anesthesia Physical Anesthesia Plan  ASA: III  Anesthesia Plan: General ETT   Post-op Pain Management:    Induction:   PONV Risk Score and Plan: 3 and Ondansetron and Midazolam  Airway Management Planned:   Additional Equipment:   Intra-op Plan:   Post-operative Plan:   Informed Consent: I have reviewed the patients History and Physical, chart, labs and discussed the procedure including the risks, benefits and alternatives for the proposed anesthesia with the patient or authorized representative who has indicated his/her understanding and acceptance.   Dental Advisory Given  Plan Discussed with: CRNA  Anesthesia Plan Comments:         Anesthesia Quick Evaluation

## 2017-08-22 NOTE — Anesthesia Post-op Follow-up Note (Signed)
Anesthesia QCDR form completed.        

## 2017-08-23 ENCOUNTER — Encounter: Payer: Self-pay | Admitting: Obstetrics and Gynecology

## 2017-08-24 LAB — CYTOLOGY - NON PAP

## 2017-08-24 LAB — SURGICAL PATHOLOGY

## 2017-08-29 ENCOUNTER — Encounter: Payer: Self-pay | Admitting: Obstetrics and Gynecology

## 2017-08-29 ENCOUNTER — Ambulatory Visit (INDEPENDENT_AMBULATORY_CARE_PROVIDER_SITE_OTHER): Payer: Medicaid Other | Admitting: Obstetrics and Gynecology

## 2017-08-29 VITALS — BP 142/70 | Ht 62.0 in | Wt 221.0 lb

## 2017-08-29 DIAGNOSIS — Z09 Encounter for follow-up examination after completed treatment for conditions other than malignant neoplasm: Secondary | ICD-10-CM

## 2017-08-29 NOTE — Progress Notes (Signed)
   Postoperative Follow-up Patient presents post op from left salpingo-oophorectomy 1weeks ago for adnexal mass.  Subjective: Patient reports marked improvement in her preop symptoms. Eating a regular diet without difficulty. The patient is not having any pain.  Activity: normal activities of daily living.  Objective: Vitals:   08/29/17 1135  BP: (!) 142/70   Vital Signs: BP (!) 142/70   Ht 5\' 2"  (1.575 m)   Wt 221 lb (100.2 kg)   BMI 40.42 kg/m  Constitutional: Well nourished, well developed female in no acute distress.  HEENT: normal Skin: Warm and dry.  Extremity: no edema  Abdomen: Soft, non-tender, normal bowel sounds; no bruits, organomegaly or masses. clean, dry, intact and no erythema, induration, warmth, and tenderness  Assessment: 57 y.o. s/p laparoscopic LSO progressing well  Plan: Patient has done well after surgery with no apparent complications.  I have discussed the post-operative course to date, and the expected progress moving forward.  The patient understands what complications to be concerned about.  I will see the patient in routine follow up, or sooner if needed.    Activity plan: increase activity slowly  Discussed path results of benign cystadenofibroma.  No follow up needed.  Prentice Docker, MD 08/29/2017, 12:07 PM   CC: Joylene Draft, NP [address] [place], [place] [address]

## 2017-09-06 ENCOUNTER — Other Ambulatory Visit: Payer: Self-pay | Admitting: Obstetrics and Gynecology

## 2017-09-07 ENCOUNTER — Other Ambulatory Visit: Payer: Self-pay | Admitting: Obstetrics and Gynecology

## 2017-09-11 ENCOUNTER — Telehealth: Payer: Self-pay

## 2017-09-11 NOTE — Telephone Encounter (Signed)
Pt states she is having pain only on right side. Requesting more pain medication. Does pt need to come in to be seen again? Please advise

## 2017-09-11 NOTE — Telephone Encounter (Signed)
Patient needs to be seen again.

## 2017-09-11 NOTE — Telephone Encounter (Signed)
Please let me know when patient calls me back

## 2017-09-11 NOTE — Telephone Encounter (Signed)
Pt needs Dr Glennon Mac or his Nurse to call her, she is having Only right sided pain

## 2017-09-12 NOTE — Telephone Encounter (Signed)
Patient is schedule 09/13/17

## 2017-09-13 ENCOUNTER — Ambulatory Visit: Payer: Medicaid Other | Admitting: Obstetrics and Gynecology

## 2017-10-06 ENCOUNTER — Ambulatory Visit: Admission: RE | Admit: 2017-10-06 | Payer: Medicaid Other | Source: Ambulatory Visit

## 2017-10-09 ENCOUNTER — Ambulatory Visit: Payer: BLUE CROSS/BLUE SHIELD | Admitting: Oncology

## 2017-10-10 ENCOUNTER — Other Ambulatory Visit: Payer: Self-pay | Admitting: Oncology

## 2017-10-13 ENCOUNTER — Ambulatory Visit
Admission: RE | Admit: 2017-10-13 | Discharge: 2017-10-13 | Disposition: A | Discharge status: Home / Self Care | Payer: Medicaid Other | Source: Ambulatory Visit | Attending: Oncology | Admitting: Oncology

## 2017-10-13 DIAGNOSIS — Z902 Acquired absence of lung [part of]: Secondary | ICD-10-CM | POA: Diagnosis not present

## 2017-10-13 DIAGNOSIS — R590 Localized enlarged lymph nodes: Secondary | ICD-10-CM | POA: Diagnosis not present

## 2017-10-13 DIAGNOSIS — R911 Solitary pulmonary nodule: Secondary | ICD-10-CM | POA: Diagnosis not present

## 2017-10-13 DIAGNOSIS — C3402 Malignant neoplasm of left main bronchus: Secondary | ICD-10-CM | POA: Diagnosis present

## 2017-10-13 MED ORDER — IOPAMIDOL (ISOVUE-300) INJECTION 61%
75.0000 mL | Freq: Once | INTRAVENOUS | Status: AC | PRN
  Administered 2017-10-13: 75 mL via INTRAVENOUS

## 2017-10-16 NOTE — Progress Notes (Signed)
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  INTERVAL HISTORY: Patient returns to clinic today for routine yearly evaluation and discussion of her imaging results.  She continues to feel well and remains asymptomatic.  She recently had bronchitis as well as shingles, but these have resolved.  She has right knee pain and plans to get a joint replacement in the near future.  She has no neurologic complaints. She denies any recent fevers or illnesses. She has a good appetite and denies weight loss. She has no chest pain, cough, hemoptysis, or shortness of breath. She denies any nausea, vomiting, constipation, or diarrhea. She has no urinary complaints.  Patient offers no further specific complaints today.  REVIEW OF SYSTEMS:   Review of Systems  Constitutional: Negative.  Negative for fever, malaise/fatigue and weight loss.  Respiratory: Negative.  Negative for cough and shortness of breath.   Cardiovascular: Negative.  Negative for chest pain and leg swelling.  Gastrointestinal: Negative.  Negative for abdominal pain and constipation.  Genitourinary: Negative.  Negative for dysuria.  Musculoskeletal: Positive for joint pain.  Skin: Negative.  Negative for rash.  Neurological: Negative.  Negative for sensory change, focal weakness and weakness.  Psychiatric/Behavioral: Negative.  The patient is not nervous/anxious.     As per HPI. Otherwise, a complete review of systems is negative.  PAST MEDICAL HISTORY: Past Medical History:  Diagnosis Date  . Anxiety   . Arthritis   . Cancer ([place]) 08/30/13   Lung, tx removal of upper left lobe and chemotherapy  . Chronic pain    pt  states from lung surgery and knee pain  . GERD (gastroesophageal reflux disease)   . Insomnia   . Secondary malignant neoplasm of hilus of lung with unknown primary site, unspecified laterality ([place])   . Skin cancer     PAST SURGICAL HISTORY: Past Surgical History:  Procedure Laterality Date  . ABDOMINAL HYSTERECTOMY    . CESAREAN SECTION    . CHOLECYSTECTOMY  01-02-14  . COLONOSCOPY  05-13-14   Dr Bary Castilla  . CYSTOSCOPY Bilateral 08/22/2017   Procedure: CYSTOSCOPY;  Surgeon: Will Bonnet, MD;  Location: [facility];  Service: Gynecology;  Laterality: Bilateral;  . LAPAROSCOPIC SALPINGO OOPHERECTOMY Left 08/22/2017   Procedure: LAPAROSCOPIC SALPINGO OOPHORECTOMY;  Surgeon: Will Bonnet, MD;  Location: [facility];  Service: Gynecology;  Laterality: Left;  . left shoulder blade Left 30 years ago   removed extra bone  . NASAL TURBINATE REDUCTION Bilateral 01/08/2016   Procedure: TURBINATE REDUCTION/SUBMUCOSAL RESECTION;  Surgeon: [name], MD;  Location: [place];  Service: ENT;  Laterality: Bilateral;  . SEPTOPLASTY Bilateral 01/08/2016   Procedure: SEPTOPLASTY;  Surgeon: [name], MD;  Location: [place];  Service: ENT;  Laterality: Bilateral;  . upper lobectomy Left     FAMILY HISTORY: Family History  Problem Relation Age of Onset  . Thyroid cancer Mother   . Heart attack Father   . Thyroid cancer Brother     ADVANCED DIRECTIVES (Y/N):  N  HEALTH MAINTENANCE: Social History   Tobacco Use  . Smoking status: Current Some Day Smoker    Packs/day: 0.50    Years: 35.00    Pack years: 17.50  Types: Cigarettes  . Smokeless tobacco: Never Used  Substance Use Topics  . Alcohol use: Yes    Alcohol/week: 0.0 oz    Comment: rare  . Drug use: No     Colonoscopy:  PAP:  Bone density:  Lipid panel:  Allergies  Allergen Reactions  . Seroquel [Quetiapine Fumarate] Other (See Comments)    hallunications    Current Outpatient  Medications  Medication Sig Dispense Refill  . nortriptyline (PAMELOR) 10 MG capsule Take 10 mg by mouth at bedtime.    Marland Kitchen omeprazole (PRILOSEC) 40 MG capsule Take 1 capsule (40 mg total) by mouth daily. 30 capsule 12  . oxyCODONE (ROXICODONE) 15 MG immediate release tablet Take 15 mg by mouth 5 (five) times daily.    Marland Kitchen tiZANidine (ZANAFLEX) 4 MG tablet Take 4 mg by mouth at bedtime.    . Vitamin D, Ergocalciferol, (DRISDOL) 50000 units CAPS capsule Take 50,000 Units by mouth every Thursday.    Marland Kitchen albuterol (VENTOLIN HFA) 108 (90 Base) MCG/ACT inhaler TAKE TWO PUFFS EVERY 4 TO 6 HOURS AS NEEDED FOR SHORTNESS OF BREATH. (Patient not taking: Reported on 10/17/2017) 18 g 2  . furosemide (LASIX) 40 MG tablet Take 40 mg by mouth daily as needed (for fluid retention/swelling).    Marland Kitchen ibuprofen (ADVIL,MOTRIN) 600 MG tablet Take 1 tablet (600 mg total) by mouth every 6 (six) hours as needed (breakthrough pain). (Patient not taking: Reported on 10/17/2017) 30 tablet 0  . oxyCODONE (ROXICODONE) 5 MG immediate release tablet Take 1 tablet (5 mg total) by mouth every 6 (six) hours as needed for breakthrough pain. (Patient not taking: Reported on 10/17/2017) 10 tablet 0  . potassium chloride (K-DUR) 10 MEQ tablet Take 10 mEq by mouth daily as needed (with lasix use for retention/swelling).     No current facility-administered medications for this visit.     OBJECTIVE: Vitals:   10/17/17 1059  BP: 132/79  Pulse: 94  Resp: 18  Temp: (!) 96.7 F (35.9 C)     Body mass index is 38.81 kg/m.    ECOG FS:0 - Asymptomatic  General: Well-developed, well-nourished, no acute distress. Eyes: Pink conjunctiva, anicteric sclera. Lungs: Clear to auscultation bilaterally. Heart: Regular rate and rhythm. No rubs, murmurs, or gallops. Abdomen: Soft, nontender, nondistended. No organomegaly noted, normoactive bowel sounds. Musculoskeletal: No edema, cyanosis, or clubbing. Neuro: Alert, answering all questions  appropriately. Cranial nerves grossly intact. Skin: No rashes or petechiae noted. Psych: Normal affect.  LAB RESULTS:  Lab Results  Component Value Date   NA 139 07/18/2016   K 3.3 (L) 07/18/2016   CL 100 (L) 07/18/2016   CO2 31 07/18/2016   GLUCOSE 118 (H) 07/18/2016   BUN 12 07/18/2016   CREATININE 0.66 07/18/2016   CALCIUM 9.1 07/18/2016   PROT 7.6 07/18/2016   ALBUMIN 4.4 07/18/2016   AST 24 07/18/2016   ALT 20 07/18/2016   ALKPHOS 77 07/18/2016   BILITOT 0.5 07/18/2016   GFRNONAA >60 07/18/2016   GFRAA >60 07/18/2016    Lab Results  Component Value Date   WBC 12.1 (H) 07/18/2016   NEUTROABS 7.2 (H) 07/18/2016   HGB 13.4 07/18/2016   HCT 38.9 07/18/2016   MCV 85.3 07/18/2016   PLT 312 07/18/2016     STUDIES: Ct Chest W Contrast  Result Date: 10/13/2017 CLINICAL DATA:  Restaging left lung cancer. EXAM: CT CHEST WITH CONTRAST TECHNIQUE: Multidetector CT imaging of the chest was performed during intravenous contrast administration. CONTRAST:  57m ISOVUE-300 IOPAMIDOL (  ISOVUE-300) INJECTION 61% COMPARISON:  08/10/2016 FINDINGS: Cardiovascular: The heart size appears within normal limits. There is no pericardial effusion. Mediastinum/Nodes: The thyroid gland appears normal. The trachea appears patent and is midline. Normal appearance of the esophagus. Pre-vascular lymph node measures 1.1 cm. Previously 1 cm. Subcarinal lymph node measures 1 cm, image 56/2. Previously 0.9 cm. Stable low right paratracheal lymph node measuring 1.2 cm. Lungs/Pleura: No pleural effusion. Status post left upper lobectomy. No complicating features identified. The sub solid nodule within the posterior right lower lobe is stable measuring 7 mm, image 81/3. Subpleural nodule overlying the posterior right upper lobe measures 4 mm and is unchanged from comparison exam. Lateral right apical nodule is stable measuring 5 mm, image 22/3. Upper Abdomen: No acute abnormality. There is a intermediate  attenuating, indeterminate lesion arising from upper pole of left kidney measuring 1.5 cm. Unchanged from comparison exam. 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 No aggressive lytic or sclerotic bone lesions identified. IMPRESSION: 1. No complications status post left upper lobectomy. 2. No evidence for local tumor recurrence or metastatic disease. 3. Prominent mediastinal and right hilar lymph nodes are unchanged from previous exam. 4. Stable sub solid right lower lobe pulmonary nodule for which 4 years stability has been demonstrated, probably benign. Annual CT is recommended until 5 years of stability has been established. This recommendation follows the consensus statement: Guidelines for Management of Incidental Pulmonary Nodules Detected on CT Images: From the Fleischner Society 2017; Radiology 2017; 284:228-243. Electronically Signed   By: Kerby Moors M.D.   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 18 lymph nodes were negative. EGFR and ALK were negative as well. She subsequently received 4 cycles of adjuvant cisplatin and Alimta between May and August 2015. She did not receive maintenance Alimta.  Patient's most recent CT scan on Oct 13, 2017 was reviewed independently and report as above with no evidence of recurrent or metastatic disease.  No intervention is needed at this time.  Return to clinic in 1 year with repeat imaging and further evaluation.  At this point, patient will be nearly 5 years removed from completing her treatments and likely can be discharged from clinic.   Approximately 20 minutes was spent in discussion of which greater than 50% was consultation.   Patient expressed understanding and was in agreement with this plan. She also understands that She can call clinic at  any time with any questions, concerns, or complaints.   Cancer Staging Lung cancer [facility]) Staging form: Lung, AJCC 7th Edition - Clinical: Stage IIB (yT3, N0, M0) - Unsigned   Lloyd Huger, MD   10/20/2017 7:46 AM

## 2017-10-17 ENCOUNTER — Other Ambulatory Visit: Payer: Self-pay

## 2017-10-17 ENCOUNTER — Inpatient Hospital Stay: Payer: Medicaid Other | Attending: Oncology | Admitting: Oncology

## 2017-10-17 VITALS — BP 132/79 | HR 94 | Temp 96.7°F | Resp 18 | Wt 212.2 lb

## 2017-10-17 DIAGNOSIS — M25561 Pain in right knee: Secondary | ICD-10-CM | POA: Insufficient documentation

## 2017-10-17 DIAGNOSIS — Z9223 Personal history of estrogen therapy: Secondary | ICD-10-CM | POA: Insufficient documentation

## 2017-10-17 DIAGNOSIS — C3402 Malignant neoplasm of left main bronchus: Secondary | ICD-10-CM

## 2017-10-17 DIAGNOSIS — Z85118 Personal history of other malignant neoplasm of bronchus and lung: Secondary | ICD-10-CM | POA: Diagnosis not present

## 2017-10-17 NOTE — Progress Notes (Signed)
Here for follow up. Pt stated she getting over bronchitis and shingles- ( under R eye ) on no meds now for either. Feeling better she stated.   Waiting for full recovery to have R knee replacement.

## 2018-07-06 ENCOUNTER — Encounter: Payer: Self-pay | Admitting: Emergency Medicine

## 2018-07-06 ENCOUNTER — Other Ambulatory Visit: Payer: Self-pay

## 2018-07-06 DIAGNOSIS — Z85118 Personal history of other malignant neoplasm of bronchus and lung: Secondary | ICD-10-CM | POA: Diagnosis not present

## 2018-07-06 DIAGNOSIS — Z79899 Other long term (current) drug therapy: Secondary | ICD-10-CM | POA: Insufficient documentation

## 2018-07-06 DIAGNOSIS — R4781 Slurred speech: Secondary | ICD-10-CM | POA: Diagnosis not present

## 2018-07-06 DIAGNOSIS — F17210 Nicotine dependence, cigarettes, uncomplicated: Secondary | ICD-10-CM | POA: Diagnosis not present

## 2018-07-06 DIAGNOSIS — I10 Essential (primary) hypertension: Secondary | ICD-10-CM | POA: Diagnosis present

## 2018-07-06 LAB — CBC WITH DIFFERENTIAL/PLATELET
ABS IMMATURE GRANULOCYTES: 0.04 10*3/uL (ref 0.00–0.07)
BASOS ABS: 0.1 10*3/uL (ref 0.0–0.1)
BASOS PCT: 1 %
Eosinophils Absolute: 0.1 10*3/uL (ref 0.0–0.5)
Eosinophils Relative: 1 %
HCT: 45.5 % (ref 36.0–46.0)
Hemoglobin: 14.6 g/dL (ref 12.0–15.0)
IMMATURE GRANULOCYTES: 0 %
Lymphocytes Relative: 44 %
Lymphs Abs: 6.1 10*3/uL — ABNORMAL HIGH (ref 0.7–4.0)
MCH: 29.2 pg (ref 26.0–34.0)
MCHC: 32.1 g/dL (ref 30.0–36.0)
MCV: 91 fL (ref 80.0–100.0)
MONOS PCT: 5 %
Monocytes Absolute: 0.7 10*3/uL (ref 0.1–1.0)
NEUTROS ABS: 6.8 10*3/uL (ref 1.7–7.7)
NEUTROS PCT: 49 %
NRBC: 0 % (ref 0.0–0.2)
PLATELETS: 303 10*3/uL (ref 150–400)
RBC: 5 MIL/uL (ref 3.87–5.11)
RDW: 12.9 % (ref 11.5–15.5)
WBC: 13.8 10*3/uL — ABNORMAL HIGH (ref 4.0–10.5)

## 2018-07-06 LAB — COMPREHENSIVE METABOLIC PANEL
ALBUMIN: 4.7 g/dL (ref 3.5–5.0)
ALT: 23 U/L (ref 0–44)
AST: 18 U/L (ref 15–41)
Alkaline Phosphatase: 104 U/L (ref 38–126)
Anion gap: 6 (ref 5–15)
BUN: 16 mg/dL (ref 6–20)
CHLORIDE: 100 mmol/L (ref 98–111)
CO2: 32 mmol/L (ref 22–32)
CREATININE: 0.71 mg/dL (ref 0.44–1.00)
Calcium: 9.2 mg/dL (ref 8.9–10.3)
GFR calc Af Amer: >60 mL/min (ref >60)
GLUCOSE: 118 mg/dL — AB (ref 70–99)
POTASSIUM: 4.7 mmol/L (ref 3.5–5.1)
SODIUM: 138 mmol/L (ref 135–145)
Total Bilirubin: 0.3 mg/dL (ref 0.3–1.2)
Total Protein: 7.9 g/dL (ref 6.5–8.1)

## 2018-07-06 LAB — ETHANOL: Alcohol, Ethyl (B): <10 mg/dL (ref <10)

## 2018-07-06 NOTE — ED Triage Notes (Signed)
Pt arrives POV to triage with c/o HTN and reports that about two hours ago that she was becoming slow in her speech. Pt's speech is clear at this time in triage and pt has no neurological deficits at this time. Pt is in NAD.

## 2018-07-06 NOTE — ED Notes (Signed)
Per MD Malinda, no further protocols at this time.

## 2018-07-06 NOTE — ED Notes (Signed)
Pt family concerned about pt BP. This EDT rechecked pt vitals and informed pt family of current vitals.

## 2018-07-07 ENCOUNTER — Emergency Department
Admission: EM | Admit: 2018-07-07 | Discharge: 2018-07-07 | Disposition: A | Discharge status: Home / Self Care | Payer: Medicaid Other | Attending: Emergency Medicine | Admitting: Emergency Medicine

## 2018-07-07 DIAGNOSIS — R4781 Slurred speech: Secondary | ICD-10-CM

## 2018-07-07 DIAGNOSIS — R030 Elevated blood-pressure reading, without diagnosis of hypertension: Secondary | ICD-10-CM

## 2018-07-07 MED ORDER — ASPIRIN 81 MG PO CHEW
324.0000 mg | CHEWABLE_TABLET | Freq: Once | ORAL | Status: DC
  Filled 2018-07-07: qty 4

## 2018-07-07 NOTE — ED Notes (Signed)
Pt left prior to receiving d/c instructions and paperwork.
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
## 2018-07-07 NOTE — Discharge Instructions (Signed)
Your workup in the Emergency Department today was reassuring.  We did not find any specific abnormalities.  We recommend you drink plenty of fluids, take your regular medications and/or any new ones prescribed today, and follow up with the doctor(s) listed in these documents as recommended.  Return to the Emergency Department if you develop new or worsening symptoms that concern you.  

## 2018-10-18 ENCOUNTER — Ambulatory Visit
Admission: RE | Admit: 2018-10-18 | Discharge: 2018-10-18 | Disposition: A | Discharge status: Home / Self Care | Payer: Medicaid Other | Source: Ambulatory Visit | Attending: Oncology | Admitting: Oncology

## 2018-10-18 ENCOUNTER — Other Ambulatory Visit: Payer: Self-pay

## 2018-10-18 DIAGNOSIS — C3402 Malignant neoplasm of left main bronchus: Secondary | ICD-10-CM | POA: Insufficient documentation

## 2018-10-18 LAB — POCT I-STAT CREATININE: Creatinine, Ser: 0.7 mg/dL (ref 0.44–1.00)

## 2018-10-18 MED ORDER — IOHEXOL 300 MG/ML  SOLN
75.0000 mL | Freq: Once | INTRAMUSCULAR | Status: AC | PRN
  Administered 2018-10-18: 75 mL via INTRAVENOUS

## 2018-10-19 NOTE — Progress Notes (Signed)
[place]  [phone] Fax:([phone]) [phone]  ID: Natalie Stark OB: 1961/02/14  MR#: [id number]  BTD#:[id number]  Patient Care Team: Pa, [facility] as PCP - General [place] Gleason, MD (Inactive) (Unknown Physician Specialty) Robert Bellow, MD (General Surgery)  I connected with Natalie Stark on 10/27/18 at 10:45 AM EDT by video enabled telemedicine visit and verified that I am speaking with the correct person using two identifiers.   I discussed the limitations, risks, security and privacy concerns of performing an evaluation and management service by telemedicine and the availability of in-person appointments. I also discussed with the patient that there may be a patient responsible charge related to this service. The patient expressed understanding and agreed to proceed.   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  INTERVAL HISTORY: Patient agreed to video enabled telemedicine visit for her routine yearly evaluation and discussion of her imaging results.  She continues to feel well and remains asymptomatic. She has no neurologic complaints. She denies any recent fevers or illnesses. She has a good appetite and denies weight loss. She has no chest pain, cough, hemoptysis, or shortness of breath. She denies any nausea, vomiting, constipation, or diarrhea. She has no urinary complaints.  Patient feels at her baseline offers no specific complaints today.  REVIEW OF SYSTEMS:   Review of Systems  Constitutional: Negative.  Negative for fever, malaise/fatigue and weight loss.  Respiratory: Negative.  Negative for cough and shortness of breath.   Cardiovascular: Negative.  Negative for chest pain and leg swelling.  Gastrointestinal: Negative.  Negative for abdominal  pain and constipation.  Genitourinary: Negative.  Negative for dysuria.  Musculoskeletal: Negative.  Negative for joint pain.  Skin: Negative.  Negative for rash.  Neurological: Negative.  Negative for sensory change, focal weakness and weakness.  Psychiatric/Behavioral: Negative.  The patient is not nervous/anxious.     As per HPI. Otherwise, a complete review of systems is negative.  PAST MEDICAL HISTORY: Past Medical History:  Diagnosis Date  . Anxiety   . Arthritis   . Cancer ([place]) 08/30/13   Lung, tx removal of upper left lobe and chemotherapy  . Chronic pain    pt states from lung surgery and knee pain  . GERD (gastroesophageal reflux disease)   . Hypertension   . Insomnia   . Secondary malignant neoplasm of hilus of lung with unknown primary site, unspecified laterality ([place])   . Skin cancer     PAST SURGICAL HISTORY: Past Surgical History:  Procedure Laterality Date  . ABDOMINAL HYSTERECTOMY    . CESAREAN SECTION    . CHOLECYSTECTOMY  01-02-14  . COLONOSCOPY  05-13-14   Dr Bary Castilla  . CYSTOSCOPY Bilateral 08/22/2017   Procedure: CYSTOSCOPY;  Surgeon: Will Bonnet, MD;  Location: [facility];  Service: Gynecology;  Laterality: Bilateral;  . LAPAROSCOPIC SALPINGO OOPHERECTOMY Left 08/22/2017   Procedure: LAPAROSCOPIC SALPINGO OOPHORECTOMY;  Surgeon: Will Bonnet, MD;  Location: [facility];  Service: Gynecology;  Laterality: Left;  . left shoulder blade Left 30 years ago   removed extra bone  . NASAL TURBINATE REDUCTION Bilateral 01/08/2016   Procedure: TURBINATE REDUCTION/SUBMUCOSAL RESECTION;  Surgeon: [name], MD;  Location: [place];  Service: ENT;  Laterality: Bilateral;  . SEPTOPLASTY Bilateral 01/08/2016   Procedure: SEPTOPLASTY;  Surgeon: [name], MD;  Location: [place];  Service: ENT;  Laterality: Bilateral;  . upper lobectomy Left     FAMILY HISTORY: Family History  Problem Relation Age of Onset  . Thyroid cancer  Mother   . Heart attack Father   . Thyroid cancer Brother     ADVANCED DIRECTIVES (Y/N):  N  HEALTH MAINTENANCE: Social History   Tobacco Use  . Smoking status: Current Some Day Smoker    Packs/day: 0.50    Years: 35.00    Pack years: 17.50    Types: Cigarettes  . Smokeless tobacco: Never Used  Substance Use Topics  . Alcohol use: Yes    Alcohol/week: 0.0 standard drinks    Comment: rare  . Drug use: No     Colonoscopy:  PAP:  Bone density:  Lipid panel:  Allergies  Allergen Reactions  . Seroquel [Quetiapine Fumarate] Other (See Comments)    hallunications    Current Outpatient Medications  Medication Sig Dispense Refill  . albuterol (VENTOLIN HFA) 108 (90 Base) MCG/ACT inhaler TAKE TWO PUFFS EVERY 4 TO 6 HOURS AS NEEDED FOR SHORTNESS OF BREATH. 18 g 2  . furosemide (LASIX) 40 MG tablet Take 40 mg by mouth daily as needed (for fluid retention/swelling).    Marland Kitchen ibuprofen (ADVIL,MOTRIN) 600 MG tablet Take 1 tablet (600 mg total) by mouth every 6 (six) hours as needed (breakthrough pain). 30 tablet 0  . lisinopril (ZESTRIL) 10 MG tablet Take 10 mg by mouth daily.    . nortriptyline (PAMELOR) 10 MG capsule Take 10 mg by mouth at bedtime.    Marland Kitchen omeprazole (PRILOSEC) 40 MG capsule Take 1 capsule (40 mg total) by mouth daily. 30 capsule 12  . oxyCODONE (ROXICODONE) 15 MG immediate release tablet Take 15 mg by mouth 5 (five) times daily.    . potassium chloride (K-DUR) 10 MEQ tablet Take 10 mEq by mouth daily as needed (with lasix use for retention/swelling).    Marland Kitchen tiZANidine (ZANAFLEX) 4 MG tablet Take 4 mg by mouth at bedtime.    . Vitamin D, Ergocalciferol, (DRISDOL) 50000 units CAPS capsule Take 50,000 Units by mouth every Thursday.     No current facility-administered medications for this visit.     OBJECTIVE: There were no vitals filed for this visit.   There is no height or weight on file to calculate BMI.    ECOG FS:0 - Asymptomatic  General: Well-developed,  well-nourished, no acute distress. HEENT: Normocephalic. Neuro: Alert, answering all questions appropriately. Cranial nerves grossly intact. Skin: No rashes or petechiae noted. Psych: Normal affect.  LAB RESULTS:  Lab Results  Component Value Date   NA 138 07/06/2018   K 4.7 07/06/2018   CL 100 07/06/2018   CO2 32 07/06/2018   GLUCOSE 118 (H) 07/06/2018   BUN 16 07/06/2018   CREATININE 0.70 10/18/2018   CALCIUM 9.2 07/06/2018   PROT 7.9 07/06/2018   ALBUMIN 4.7 07/06/2018   AST 18 07/06/2018   ALT 23 07/06/2018   ALKPHOS 104 07/06/2018   BILITOT 0.3 07/06/2018   GFRNONAA >60 07/06/2018   GFRAA >60 07/06/2018    Lab Results  Component Value Date   WBC 13.8 (H) 07/06/2018   NEUTROABS 6.8 07/06/2018   HGB 14.6 07/06/2018   HCT 45.5 07/06/2018   MCV 91.0 07/06/2018   PLT 303 07/06/2018     STUDIES: Ct Chest W Contrast  Result Date: 10/18/2018 CLINICAL DATA:  Left upper lobe lung cancer status post left upper lobectomy in 2015. Restaging. EXAM:  CT CHEST WITH CONTRAST TECHNIQUE: Multidetector CT imaging of the chest was performed during intravenous contrast administration. CONTRAST:  11m OMNIPAQUE IOHEXOL 300 MG/ML  SOLN COMPARISON:  10/13/2017 chest CT. FINDINGS: Cardiovascular: Normal heart size. No significant pericardial effusion/thickening. Great vessels are normal in course and caliber. No central pulmonary emboli. Mediastinum/Nodes: Subcentimeter hypodense bilateral thyroid lobe nodules. Unremarkable esophagus. No axillary adenopathy. Mildly enlarged 1.2 cm right lower paratracheal node (series 2/image 58), previously 1.2 cm, stable. Mildly enlarged 1.0 cm right hilar node (series 2/image 65), previously 1.0 cm, stable. Mildly enlarged 1.0 cm left prevascular node (series 2/image 45), previously 1.1 cm, stable. No new pathologically enlarged mediastinal or hilar nodes. Lungs/Pleura: No pneumothorax. No pleural effusion. Status post left upper lobectomy. No acute  consolidative airspace disease or lung masses. 2 scattered small solid peripheral right upper lobe pulmonary nodules, largest 4 mm (series 3/image 36), both stable since at least 2017 chest CT, considered benign. Posterior right lower lobe 7 mm sub solid pulmonary nodule (series 3/image 87), stable since 03/25/2016 chest CT, probably benign. No new significant pulmonary nodules. Upper abdomen: Subcentimeter hypodense renal cortical lesion in the upper left kidney is too small to characterize and is stable, considered benign. Cholecystectomy. Musculoskeletal: No aggressive appearing focal osseous lesions. Mild thoracic spondylosis. IMPRESSION: 1. No evidence of local tumor recurrence status post left upper lobectomy. 2. No findings suspicious for metastatic disease in the chest. Mildly enlarged mediastinal and right hilar nodes remain stable on multiple prior chest CT studies, most compatible with benign reactive adenopathy. 3. Subsolid 7 mm right lower lobe pulmonary nodule is stable since 2017 chest CT and is probably benign. Continued annual chest CT surveillance recommended. Electronically Signed   By: JIlona SorrelM.D.   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 18 lymph nodes were negative. EGFR and ALK were negative as well. She subsequently received 4 cycles of adjuvant cisplatin and Alimta between May and August 2015. She did not receive maintenance Alimta.  Patient's most recent CT scan on Oct 18, 2018 revealed no evidence of progressive or recurrent disease.  She was noted to have a sub-solid 7 mm right lower lobe pulmonary nodule that has been stable since 2017.  Will repeat CT imaging in 1 year to ensure stability at which point patient can likely can be discharged from clinic.    I provided 15 minutes of face-to-face video  visit time during this encounter, and > 50% was spent counseling as documented under my assessment & plan.    Patient expressed understanding and was in agreement with this plan. She also understands that She can call clinic at any time with any questions, concerns, or complaints.   Cancer Staging Lung cancer ([facility] Staging form: Lung, AJCC 7th Edition - Clinical: Stage IIB (yT3, N0, M0) - Unsigned   TLloyd Huger MD   10/27/2018 7:54 AM

## 2018-10-24 ENCOUNTER — Telehealth: Payer: Self-pay | Admitting: Oncology

## 2018-10-25 ENCOUNTER — Other Ambulatory Visit: Payer: Self-pay

## 2018-10-25 ENCOUNTER — Inpatient Hospital Stay: Payer: Medicaid Other | Attending: Oncology | Admitting: Oncology

## 2018-10-25 ENCOUNTER — Encounter: Payer: Self-pay | Admitting: Oncology

## 2018-10-25 DIAGNOSIS — C3402 Malignant neoplasm of left main bronchus: Secondary | ICD-10-CM | POA: Diagnosis not present

## 2018-10-25 NOTE — Progress Notes (Signed)
RN called and reviewed medical history, medications, allergies, travel, fall and abuse screenings. Pt denies difficulties or concerns today.  States she already reviewed her scan results.  Pt will await doximity call from Dr Grayland Ormond.

## 2018-12-14 ENCOUNTER — Encounter: Payer: Self-pay | Admitting: General Surgery

## 2019-02-26 ENCOUNTER — Other Ambulatory Visit: Payer: Self-pay

## 2019-02-26 DIAGNOSIS — Z20822 Contact with and (suspected) exposure to covid-19: Secondary | ICD-10-CM

## 2019-02-28 LAB — NOVEL CORONAVIRUS, NAA: SARS-CoV-2, NAA: NOT DETECTED

## 2019-04-29 ENCOUNTER — Other Ambulatory Visit: Payer: Self-pay | Admitting: Nephrology

## 2019-04-29 ENCOUNTER — Other Ambulatory Visit: Payer: Self-pay

## 2019-04-29 ENCOUNTER — Ambulatory Visit
Admission: RE | Admit: 2019-04-29 | Discharge: 2019-04-29 | Disposition: A | Discharge status: Home / Self Care | Payer: Medicaid Other | Attending: Nephrology | Admitting: Nephrology

## 2019-04-29 ENCOUNTER — Ambulatory Visit
Admission: RE | Admit: 2019-04-29 | Discharge: 2019-04-29 | Disposition: A | Discharge status: Home / Self Care | Payer: Medicaid Other | Source: Ambulatory Visit | Attending: Nephrology | Admitting: Nephrology

## 2019-04-29 DIAGNOSIS — R1012 Left upper quadrant pain: Secondary | ICD-10-CM | POA: Insufficient documentation

## 2019-04-30 ENCOUNTER — Emergency Department: Payer: Medicaid Other

## 2019-04-30 ENCOUNTER — Other Ambulatory Visit: Payer: Self-pay

## 2019-04-30 ENCOUNTER — Emergency Department
Admission: EM | Admit: 2019-04-30 | Discharge: 2019-04-30 | Disposition: A | Discharge status: Home / Self Care | Payer: Medicaid Other | Attending: Emergency Medicine | Admitting: Emergency Medicine

## 2019-04-30 DIAGNOSIS — C3412 Malignant neoplasm of upper lobe, left bronchus or lung: Secondary | ICD-10-CM | POA: Insufficient documentation

## 2019-04-30 DIAGNOSIS — I2699 Other pulmonary embolism without acute cor pulmonale: Secondary | ICD-10-CM | POA: Insufficient documentation

## 2019-04-30 DIAGNOSIS — R109 Unspecified abdominal pain: Secondary | ICD-10-CM | POA: Diagnosis present

## 2019-04-30 DIAGNOSIS — Z79899 Other long term (current) drug therapy: Secondary | ICD-10-CM | POA: Insufficient documentation

## 2019-04-30 DIAGNOSIS — I10 Essential (primary) hypertension: Secondary | ICD-10-CM | POA: Diagnosis not present

## 2019-04-30 DIAGNOSIS — F17210 Nicotine dependence, cigarettes, uncomplicated: Secondary | ICD-10-CM | POA: Diagnosis not present

## 2019-04-30 LAB — CBC
HCT: 40.4 % (ref 36.0–46.0)
Hemoglobin: 13 g/dL (ref 12.0–15.0)
MCH: 30 pg (ref 26.0–34.0)
MCHC: 32.2 g/dL (ref 30.0–36.0)
MCV: 93.1 fL (ref 80.0–100.0)
Platelets: 306 10*3/uL (ref 150–400)
RBC: 4.34 MIL/uL (ref 3.87–5.11)
RDW: 12.6 % (ref 11.5–15.5)
WBC: 14.7 10*3/uL — ABNORMAL HIGH (ref 4.0–10.5)
nRBC: 0 % (ref 0.0–0.2)

## 2019-04-30 LAB — URINALYSIS, COMPLETE (UACMP) WITH MICROSCOPIC
Bacteria, UA: NONE SEEN
Bilirubin Urine: NEGATIVE
Glucose, UA: NEGATIVE mg/dL
Hgb urine dipstick: NEGATIVE
Ketones, ur: NEGATIVE mg/dL
Leukocytes,Ua: NEGATIVE
Nitrite: NEGATIVE
Protein, ur: NEGATIVE mg/dL
Specific Gravity, Urine: 1.024 (ref 1.005–1.030)
pH: 6 (ref 5.0–8.0)

## 2019-04-30 LAB — BASIC METABOLIC PANEL
Anion gap: 10 (ref 5–15)
BUN: 17 mg/dL (ref 6–20)
CO2: 29 mmol/L (ref 22–32)
Calcium: 8.4 mg/dL — ABNORMAL LOW (ref 8.9–10.3)
Chloride: 103 mmol/L (ref 98–111)
Creatinine, Ser: 0.96 mg/dL (ref 0.44–1.00)
GFR calc Af Amer: >60 mL/min (ref >60)
GFR calc non Af Amer: >60 mL/min (ref >60)
Glucose, Bld: 111 mg/dL — ABNORMAL HIGH (ref 70–99)
Potassium: 3.2 mmol/L — ABNORMAL LOW (ref 3.5–5.1)
Sodium: 142 mmol/L (ref 135–145)

## 2019-04-30 LAB — HEPATIC FUNCTION PANEL
ALT: 23 U/L (ref 0–44)
AST: 18 U/L (ref 15–41)
Albumin: 4.2 g/dL (ref 3.5–5.0)
Alkaline Phosphatase: 91 U/L (ref 38–126)
Bilirubin, Direct: <0.1 mg/dL (ref 0.0–0.2)
Total Bilirubin: 0.3 mg/dL (ref 0.3–1.2)
Total Protein: 7.7 g/dL (ref 6.5–8.1)

## 2019-04-30 LAB — LIPASE, BLOOD: Lipase: 36 U/L (ref 11–51)

## 2019-04-30 LAB — LACTIC ACID, PLASMA: Lactic Acid, Venous: 1.1 mmol/L (ref 0.5–1.9)

## 2019-04-30 MED ORDER — APIXABAN 5 MG PO TABS: 10.0000 mg | ORAL_TABLET | Freq: Two times a day (BID) | ORAL | 0 refills | Status: AC

## 2019-04-30 MED ORDER — APIXABAN 5 MG PO TABS: 10.0000 mg | ORAL_TABLET | Freq: Two times a day (BID) | ORAL | Status: DC

## 2019-04-30 MED ORDER — FLUCONAZOLE 50 MG PO TABS
150.0000 mg | ORAL_TABLET | Freq: Once | ORAL | Status: AC
  Administered 2019-04-30: 150 mg via ORAL
  Filled 2019-04-30: qty 1

## 2019-04-30 MED ORDER — APIXABAN 5 MG PO TABS
10.0000 mg | ORAL_TABLET | Freq: Once | ORAL | Status: AC
  Administered 2019-04-30: 10 mg via ORAL
  Filled 2019-04-30: qty 2

## 2019-04-30 MED ORDER — OXYCODONE-ACETAMINOPHEN 5-325 MG PO TABS
1.0000 | ORAL_TABLET | ORAL | Status: DC | PRN
  Administered 2019-04-30: 1 via ORAL
  Filled 2019-04-30: qty 1

## 2019-04-30 MED ORDER — SODIUM CHLORIDE 0.9 % IV BOLUS
1000.0000 mL | Freq: Once | INTRAVENOUS | Status: AC
Start: 2019-04-30 — End: 2019-04-30
  Administered 2019-04-30: 1000 mL via INTRAVENOUS

## 2019-04-30 MED ORDER — HYDROMORPHONE HCL 1 MG/ML IJ SOLN
0.5000 mg | Freq: Once | INTRAMUSCULAR | Status: AC
  Administered 2019-04-30: 20:00:00 0.5 mg via INTRAVENOUS
  Filled 2019-04-30 (×2): qty 1

## 2019-04-30 MED ORDER — IOHEXOL 350 MG/ML SOLN
100.0000 mL | Freq: Once | INTRAVENOUS | Status: AC | PRN
  Administered 2019-04-30: 100 mL via INTRAVENOUS
  Filled 2019-04-30: qty 100

## 2019-04-30 MED ORDER — ONDANSETRON HCL 4 MG/2ML IJ SOLN
4.0000 mg | Freq: Once | INTRAMUSCULAR | Status: AC
  Administered 2019-04-30: 20:00:00 4 mg via INTRAVENOUS
  Filled 2019-04-30 (×2): qty 2

## 2019-04-30 NOTE — ED Provider Notes (Signed)
[facility] Emergency Department Provider Note  ____________________________________________   First MD Initiated Contact with Patient 04/30/19 1829     (approximate)  I have reviewed the triage vital signs and the nursing notes.   HISTORY  Chief Complaint Flank Pain    HPI Natalie Stark is a 58 y.o. female with current lung cancer status post removal and chemotherapy who presents with severe left flank pain.  Patient denies history of kidney stones.  Denies any nausea, vomiting or painful urination.  She states that her pain has been going on for 3 days, intermittent but now more constant.  It is on her left flank and radiates into her groin.  She denies any overt shortness of breath but does say that she feels like the pain is causing her to catch her breath.  Denies any pain in her chest.          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, unspecified laterality [facility])    Skin cancer     Patient Active Problem List   Diagnosis Date Noted   Left ovarian cyst 08/09/2017   Cough 06/05/2016   Bronchitis 06/05/2016   Edema extremities 04/05/2016   Migraine 10/05/2015   Chronic nasal congestion 07/09/2015   Palpitations 03/02/2015   Chest pain 03/02/2015   Menopausal flushing 12/09/2014   Hypertension 11/04/2014   Insomnia    Diarrhea 04/25/2014   Encounter for screening colonoscopy 04/25/2014   Cyst of skin 03/11/2014   Postprandial diarrhea 03/11/2014   Indigestion 01/10/2014   Gallstones 12/18/2013   Lung cancer ([place]) 12/18/2013    Past Surgical History:  Procedure Laterality Date   ABDOMINAL  HYSTERECTOMY     CESAREAN SECTION     CHOLECYSTECTOMY  01-02-14   COLONOSCOPY  05-13-14   Dr Bary Castilla   CYSTOSCOPY Bilateral 08/22/2017   Procedure: CYSTOSCOPY;  Surgeon: Will Bonnet, MD;  Location: [facility] ORS;  Service: Gynecology;  Laterality: Bilateral;   LAPAROSCOPIC SALPINGO OOPHERECTOMY Left 08/22/2017   Procedure: LAPAROSCOPIC SALPINGO OOPHORECTOMY;  Surgeon: Will Bonnet, MD;  Location: [facility] ORS;  Service: Gynecology;  Laterality: Left;   left shoulder blade Left 30 years ago   removed extra bone   NASAL TURBINATE REDUCTION Bilateral 01/08/2016   Procedure: TURBINATE REDUCTION/SUBMUCOSAL RESECTION;  Surgeon: [name], MD;  Location: [place];  Service: ENT;  Laterality: Bilateral;   SEPTOPLASTY Bilateral 01/08/2016   Procedure: SEPTOPLASTY;  Surgeon: [name], MD;  Location: [place];  Service: ENT;  Laterality: Bilateral;   upper lobectomy Left     Prior to Admission medications   Medication Sig Start Date End Date Taking? Authorizing Provider  albuterol (VENTOLIN HFA) 108 (90 Base) MCG/ACT inhaler TAKE TWO PUFFS EVERY 4 TO 6 HOURS AS NEEDED FOR SHORTNESS OF BREATH. 07/09/15   Guadalupe Maple, MD  furosemide (LASIX) 40 MG tablet Take 40 mg by mouth daily as needed (for fluid retention/swelling).    [provider]  ibuprofen (ADVIL,MOTRIN) 600 MG tablet Take 1 tablet (600 mg total) by mouth every 6 (six) hours as needed (breakthrough pain). 08/22/17  Will Bonnet, MD  lisinopril (ZESTRIL) 10 MG tablet Take 10 mg by mouth daily.    [provider]  nortriptyline (PAMELOR) 10 MG capsule Take 10 mg by mouth at bedtime.    [provider]  omeprazole (PRILOSEC) 40 MG capsule Take 1 capsule (40 mg total) by mouth daily. 12/09/14   Guadalupe Maple, MD  oxyCODONE (ROXICODONE) 15 MG immediate release tablet Take 15 mg by mouth 5 (five) times daily.    [provider]  potassium chloride (K-DUR) 10 MEQ  tablet Take 10 mEq by mouth daily as needed (with lasix use for retention/swelling).    [provider]  tiZANidine (ZANAFLEX) 4 MG tablet Take 4 mg by mouth at bedtime.    [provider]  Vitamin D, Ergocalciferol, (DRISDOL) 50000 units CAPS capsule Take 50,000 Units by mouth every Thursday.    [provider]    Allergies Seroquel [quetiapine fumarate]  Family History  Problem Relation Age of Onset   Thyroid cancer Mother    Heart attack Father    Thyroid cancer Brother     Social History Social History   Tobacco Use   Smoking status: Current Some Day Smoker    Packs/day: 0.50    Years: 35.00    Pack years: 17.50    Types: Cigarettes   Smokeless tobacco: Never Used  Substance Use Topics   Alcohol use: Yes    Alcohol/week: 0.0 standard drinks    Comment: rare   Drug use: No      Review of Systems Constitutional: No fever/chills Eyes: No visual changes. ENT: No sore throat. Cardiovascular: Denies chest pain. Respiratory: + sob with the flank pain Gastrointestinal: No abdominal pain.  No nausea, no vomiting.  No diarrhea.  No constipation. Genitourinary: Negative for dysuria. Musculoskeletal: Positive flank pain. Skin: Negative for rash. Neurological: Negative for headaches, focal weakness or numbness. All other ROS negative ____________________________________________   PHYSICAL EXAM:  VITAL SIGNS: ED Triage Vitals  Enc Vitals Group     BP 04/30/19 1659 (!) 164/96     Pulse Rate 04/30/19 1659 (!) 115     Resp 04/30/19 1659 20     Temp 04/30/19 1659 (!) 97.5 F (36.4 C)     Temp Source 04/30/19 1659 Oral     SpO2 04/30/19 1659 99 %     Weight 04/30/19 1659 204 lb (92.5 kg)     Height 04/30/19 1659 5\' 2"  (1.575 m)     Head Circumference --      Peak Flow --      Pain Score 04/30/19 1714 10     Pain Loc --      Pain Edu? --      Excl. in [place]? --     Constitutional: Alert and oriented. Well appearing and in no acute  distress. Eyes: Conjunctivae are normal. EOMI. Head: Atraumatic. Nose: No congestion/rhinnorhea. Mouth/Throat: Mucous membranes are moist.   Neck: No stridor. Trachea Midline. FROM Cardiovascular: Tachycardic, regular rhythm. Grossly normal heart sounds.  Good peripheral circulation. Respiratory: Normal respiratory effort.  No retractions. Lungs CTAB. Gastrointestinal: Soft and nontender. No distention. No abdominal bruits.  Musculoskeletal: No lower extremity tenderness nor edema.  No joint effusions. Neurologic:  Normal speech and language. No gross focal neurologic deficits are appreciated.  Skin:  Skin is warm, dry and intact. No rash noted. Psychiatric: Mood and affect are normal. Speech and behavior are normal. GU: Deferred   ____________________________________________   LABS (all labs ordered  are listed, but only abnormal results are displayed)  Labs Reviewed  URINALYSIS, COMPLETE (UACMP) WITH MICROSCOPIC - Abnormal; Notable for the following components:      Result Value   Color, Urine YELLOW (*)    APPearance HAZY (*)    All other components within normal limits  BASIC METABOLIC PANEL - Abnormal; Notable for the following components:   Potassium 3.2 (*)    Glucose, Bld 111 (*)    Calcium 8.4 (*)    All other components within normal limits  CBC - Abnormal; Notable for the following components:   WBC 14.7 (*)    All other components within normal limits  LACTIC ACID, PLASMA  HEPATIC FUNCTION PANEL  LIPASE, BLOOD  LACTIC ACID, PLASMA   ____________________________________________   ED ECG REPORT I, Vanessa [place], the attending physician, personally viewed and interpreted this ECG.  EKG is sinus tachycardia rate of 110, no ST elevations, no T wave inversions, normal intervals ____________________________________________  RADIOLOGY   Official radiology report(s): Ct Renal Stone Study  Result Date: 04/30/2019 CLINICAL DATA:  Left flank pain since Sunday  morning no history of urolithiasis EXAM: CT ABDOMEN AND PELVIS WITHOUT CONTRAST TECHNIQUE: Multidetector CT imaging of the abdomen and pelvis was performed following the standard protocol without IV contrast. COMPARISON:  CT chest Oct 18, 2018, CT abdomen pelvis April 07, 2016 FINDINGS: Lower chest: Bandlike areas of scarring and/or atelectasis in the left lung base. Lung bases otherwise clear. Normal heart size. No pericardial effusion. Hepatobiliary: No focal liver abnormality is seen. Patient is post cholecystectomy. Slight prominence of the biliary tree likely related to reservoir effect. No calcified intraductal gallstones. Pancreas: Unremarkable. No pancreatic ductal dilatation or surrounding inflammatory changes. Spleen: Normal in size without focal abnormality. Adrenals/Urinary Tract: Normal adrenal glands. Stable left upper pole renal cyst. Stable fat attenuation lesion in the right lower pole likely angiomyolipoma. No worrisome renal lesions. No visible urolithiasis or hydronephrosis. Urinary bladder is largely decompressed at the time of exam and therefore poorly evaluated by CT imaging. No bladder calculi or calcifications along the expected course of the ureter. Stomach/Bowel: Distal esophagus, stomach and duodenal sweep are unremarkable. No small bowel wall thickening or dilatation. No evidence of obstruction. A normal appendix is visualized. No colonic dilatation or wall thickening. Scattered colonic diverticula without focal pericolonic inflammation to suggest diverticulitis. Vascular/Lymphatic: Atherosclerotic plaque within the normal caliber aorta. No suspicious or enlarged lymph nodes in the included lymphatic chains. Reproductive: Uterus is surgically absent. No concerning adnexal lesions. Other: Postsurgical changes from prior low transverse abdominal incision. No bowel containing hernias. No free air or free fluid in the abdomen or pelvis. No organized collection or abscess. Musculoskeletal:  Partial lumbarization of the L5 vertebra. Mild grade 1 anterolisthesis L4 on L5. Multilevel degenerative changes are present in the imaged portions of the spine. No acute osseous abnormality or suspicious osseous lesion. IMPRESSION: 1. No acute CT findings to account for left flank pain. Specifically, no visible urolithiasis or hydronephrosis. 2. Stable left upper pole renal cyst. Stable right lower pole angiomyolipoma. 3. Colonic diverticulosis without evidence of diverticulitis. 4. Aortic Atherosclerosis (ICD10-I70.0). Electronically Signed   By: Lovena Le M.D.   On: 04/30/2019 18:53   Ct Angio Chest/abd/pel For Dissection W And/or Wo Contrast  Result Date: 04/30/2019 CLINICAL DATA:  Left-sided pain for 2 days EXAM: CT ANGIOGRAPHY CHEST, ABDOMEN AND PELVIS TECHNIQUE: Multidetector CT imaging through the chest, abdomen and pelvis was performed using the standard protocol during bolus administration of intravenous  contrast. Multiplanar reconstructed images and MIPs were obtained and reviewed to evaluate the vascular anatomy. CONTRAST:  150mL OMNIPAQUE IOHEXOL 350 MG/ML SOLN COMPARISON:  CT of the abdomen and pelvis from earlier in the same day as well as a CT of the chest from 10/14/2018 FINDINGS: CTA CHEST FINDINGS Cardiovascular: Initial precontrast images demonstrate no hyperdense crescent within aortic wall. Post-contrast images demonstrate normal enhancement of the thoracic aorta. Minimal atherosclerotic changes are noted. No aneurysmal dilatation or dissection is seen. Normal branching pattern of the aorta is noted. The pulmonary artery is opacified but not timed for embolus evaluation. There is however evidence of filling defects in the right upper and right lower lobe consistent with acute pulmonary emboli. No right heart strain is seen. Mediastinum/Nodes: Thoracic inlet demonstrates a hypodense lesion within the left lobe of the thyroid measuring less than 1 cm. This is stable from the prior CT of  the chest. Previously seen mediastinal adenopathy is again identified but decreased in size. These were likely reactive in nature. No sizable adenopathy is seen. The esophagus as visualized is within normal limits. Lungs/Pleura: Lungs demonstrate evidence of prior left upper lobectomy. Scarring is seen stable from the previous exam. No focal infiltrate or sizable effusion is noted. Stable nodules are noted within the right lung. These are best seen on image number 26 of series 6 and image number 79 of series 6. These are slightly less prominent than that noted on the prior exam. Given their long-term stability since 2017 there felt to be benign in etiology. No new nodules are seen. Musculoskeletal: Degenerative changes of the thoracic spine are noted. Postsurgical changes in the left ribcage are seen consistent with the given clinical history. Review of the MIP images confirms the above findings. CTA ABDOMEN AND PELVIS FINDINGS VASCULAR Aorta: Abdominal aorta demonstrates minimal atherosclerotic change. No aneurysmal dilatation is noted. Celiac: Patent without evidence of aneurysm, dissection, vasculitis or significant stenosis. SMA: Patent without evidence of aneurysm, dissection, vasculitis or significant stenosis. Renals: Single renal arteries are identified bilaterally. No focal stenosis or aneurysmal dilatation is noted. IMA: Patent without evidence of aneurysm, dissection, vasculitis or significant stenosis. Iliacs: Patent without evidence of aneurysm, dissection, vasculitis or significant stenosis. Veins: No specific venous abnormality is noted. Review of the MIP images confirms the above findings. NON-VASCULAR Hepatobiliary: No focal liver abnormality is seen. Status post cholecystectomy. No biliary dilatation. Pancreas: Unremarkable. No pancreatic ductal dilatation or surrounding inflammatory changes. Spleen: Normal in size without focal abnormality. Adrenals/Urinary Tract: Adrenal glands are within normal  limits. Kidneys demonstrate a normal enhancement pattern. No renal calculi or urinary tract obstructive changes are seen a small cyst is noted in the upper pole of the left kidney measuring 1 cm. No obstructive changes are noted. The bladder is partially distended. Stomach/Bowel: Scattered diverticular change of the colon is noted without evidence of diverticulitis. No obstructive or inflammatory changes are seen. The appendix is within normal limits. No small bowel abnormality is noted. The stomach is unremarkable as well. Lymphatic: No focal lymphadenopathy is identified. Reproductive: Status post hysterectomy. No adnexal masses. Other: No abdominal wall hernia or abnormality. No abdominopelvic ascites. Musculoskeletal: Mild degenerative changes of the lumbar spine are noted. No compression deformities are seen. Review of the MIP images confirms the above findings. IMPRESSION: CTA of the chest: No evidence of aneurysmal dilatation or dissection. Small peripheral pulmonary emboli are noted in the right upper lobe and right lower lobe without evidence of right heart strain. Stable nodules dating back to 2017  consistent with benign etiology. Changes of left upper lobectomy without evidence of recurrence. Small less than 1 cm nodule in the left lobe of the thyroid. No followup recommended (ref: J Am Coll Radiol. 2015 Feb;12(2): 143-50). CTA of the abdomen and pelvis: Normal appearing aorta. Diverticulosis without evidence of diverticulitis. No acute abnormality is noted. Critical Value/emergent results were called by telephone at the time of interpretation on 04/30/2019 at 9:02 pm to Wess Botts, MD , who verbally acknowledged these results. Electronically Signed   By: Inez Catalina M.D.   On: 04/30/2019 21:02    ____________________________________________   PROCEDURES  Procedure(s) performed (including Critical Care):  Procedures   ____________________________________________   INITIAL  IMPRESSION / ASSESSMENT AND PLAN / ED COURSE  ORA BOLLIG was evaluated in Emergency Department on 04/30/2019 for the symptoms described in the history of present illness. She was evaluated in the context of the global COVID-19 pandemic, which necessitated consideration that the patient might be at risk for infection with the SARS-CoV-2 virus that causes COVID-19. Institutional protocols and algorithms that pertain to the evaluation of patients at risk for COVID-19 are in a state of rapid change based on information released by regulatory bodies including the CDC and federal and state organizations. These policies and algorithms were followed during the patient's care in the ED.    Patient is a 58 year old who presents with acute pain in her left flank.  Will get CT renal to evaluate for kidney stone.  Will get urine to evaluate for UTI/Pilo.  Will get labs to evaluate for electrolyte abnormalities, AKI.  UA without evidence of UTI.  Did have yeast on it.  Will give 1 dose of fluconazole and sent for culture.  CT scan was negative.  Reevaluated patient she continued to have severe pain.  Given patient's hypertension and significant pain will get CT dissection to further evaluate.  Patient given a dose of Dilaudid while awaiting CT scan.  Scan was negative for acute pathology that would be causing her left flank pain however did show small peripheral pulmonary emboli on the right upper and right lower lobe.  No evidence of right heart strain.  I do not think this is causing her pain however given the new finding and her tachycardia I recommended admission for echocardiogram, troponins, pain control and further work-up.  Her lactate was normal without evidence of hypoperfusion.  Her white count was elevated but there was no other signs of infection but she technically meets sirs criteria.  However nothing based upon exam or her labs that I feel I should start antibiotics for.   Patient states that  she needs to go out and smoke a cigarette and explained to her that we cannot do that.  I offered her nicotine patch and she declined.  She states that she just consenting for nerves.  I offered her Ativan and she declines.  She states that her daughter is having surgery tomorrow and that she needs to be there for it.  She also lost 3 daughters previously and that she needs to be there for the surgery.  I explained patient that she would be leaving [place] given she appears in significant pain of her left flank and with a CT showing some new PE she probably needs a observation..  I  Offered patient pain meds or anxiety meds to encourage her to stay.  Patient is crying and clearly upset but she is not willing to stay in the hospital overnight.  Patient has capacity to make this decision and she understands and can repeat back to me the risk including death and permanent disability.   Patient denies any risk of bleeding.  No prior head bleeds.  No GI symptoms.  Given this we will give her 7 days of Eliquis 10 mg twice daily.  Given her first dose here.  Patient will follow up with her primary care doctor for the additional Eliquis.    ____________________________________________   FINAL CLINICAL IMPRESSION(S) / ED DIAGNOSES   Final diagnoses:  Acute pulmonary embolism, unspecified pulmonary embolism type, unspecified whether acute cor pulmonale present [facility])      MEDICATIONS GIVEN DURING THIS VISIT:  Medications  sodium chloride 0.9 % bolus 1,000 mL (0 mLs Intravenous Stopped 04/30/19 2140)  HYDROmorphone (DILAUDID) injection 0.5 mg (0.5 mg Intravenous Given 04/30/19 2026)  ondansetron (ZOFRAN) injection 4 mg (4 mg Intravenous Given 04/30/19 2024)  iohexol (OMNIPAQUE) 350 MG/ML injection 100 mL (100 mLs Intravenous Contrast Given 04/30/19 2038)  apixaban (ELIQUIS) tablet 10 mg (10 mg Oral Given 04/30/19 2139)  fluconazole (DIFLUCAN) tablet 150 mg (150 mg Oral Given 04/30/19 2139)      ED Discharge Orders         Ordered    apixaban (ELIQUIS) 5 MG TABS tablet  2 times daily     04/30/19 2141           Note:  This document was prepared using Dragon voice recognition software and may include unintentional dictation errors.   Vanessa Creight[place], MD 04/30/19 2147

## 2019-04-30 NOTE — ED Triage Notes (Signed)
Pt c/o left flank pain since Sunday morning and was seen at urgent care yesterday. Denies hx of kidney stones. Denies N/V or painful urination

## 2019-04-30 NOTE — ED Notes (Signed)
ED Provider at bedside. 

## 2019-04-30 NOTE — ED Notes (Signed)
While this RN was obtaining EKG, pt noted to be on phone with daughter discussing admission. Pt initially agreeable to stay in hospital. When pt ended phone call, pt informed this RN that pt's daughter was having surgery tomorrow.Pt became tearful when discussing this. Pt asked this RN if pt could step outside to smoke. Pt was informed that it is against policy to let the pt leave the building, especially now with COVID-19 protocols. Pt became upset at this time. MD Jari Pigg at bedside offering pt medication to calm pt's nerves and nicotine patch. Pt refusing this tx stating "I want to go home if I can't smoke". MD Jari Pigg explained that pt would be leaving against medical advice if she chose to leave the hospital. MD Jari Pigg explained risks of leaving against medical advice. Pt verbalized understanding of risks and still wishes to leave the hospital at this time. This RN D/C IV and gave pt medications before pt leaving to lobby. At time of departure pt states "Maybe next time y'all will think about letting someone smoke before they get admitted". Pt ambulatory to lobby in NAD at time of departure. Pt refusing vital signs when leaving.

## 2019-04-30 NOTE — Discharge Instructions (Signed)
Your CT scan shows some small PEs.  I do not think that this is the cause of your pain but we discussed admission for echocardiogram given your tachycardia.  You elected not to want to stay so we are going to start you on a blood thinner instead.  You should follow-up with your primary care doctor tomorrow or as soon as possible.  They can do outpatient echocardiogram.  I have only given you enough Eliquis to last for the 7 days.  After that they will need to start you on 5 mg twice a day.  Return to the ER if you develop worsening shortness of breath, chest pain or any other concerns.   CTA of the chest: No evidence of aneurysmal dilatation or dissection.   Small peripheral pulmonary emboli are noted in the right upper lobe and right lower lobe without evidence of right heart strain.   Stable nodules dating back to 2017 consistent with benign etiology.   Changes of left upper lobectomy without evidence of recurrence.   Small less than 1 cm nodule in the left lobe of the thyroid. No followup recommended (ref: J Am Coll Radiol. 2015 Feb;12(2): 143-50).   CTA of the abdomen and pelvis: Normal appearing aorta.   Diverticulosis without evidence of diverticulitis.   No acute abnormality is noted.

## 2019-04-30 NOTE — ED Notes (Signed)
Patient transported to CT 

## 2019-05-01 ENCOUNTER — Inpatient Hospital Stay: Payer: Medicaid Other

## 2019-05-01 ENCOUNTER — Other Ambulatory Visit: Payer: Self-pay

## 2019-05-01 ENCOUNTER — Encounter: Payer: Self-pay | Admitting: Oncology

## 2019-05-01 ENCOUNTER — Inpatient Hospital Stay: Payer: Medicaid Other | Attending: Oncology | Admitting: Oncology

## 2019-05-01 VITALS — BP 159/88 | HR 96 | Temp 95.8°F | Resp 19 | Wt 216.5 lb

## 2019-05-01 DIAGNOSIS — E041 Nontoxic single thyroid nodule: Secondary | ICD-10-CM | POA: Insufficient documentation

## 2019-05-01 DIAGNOSIS — F17210 Nicotine dependence, cigarettes, uncomplicated: Secondary | ICD-10-CM | POA: Insufficient documentation

## 2019-05-01 DIAGNOSIS — C3402 Malignant neoplasm of left main bronchus: Secondary | ICD-10-CM | POA: Insufficient documentation

## 2019-05-01 DIAGNOSIS — I2699 Other pulmonary embolism without acute cor pulmonale: Secondary | ICD-10-CM | POA: Insufficient documentation

## 2019-05-01 DIAGNOSIS — I10 Essential (primary) hypertension: Secondary | ICD-10-CM | POA: Insufficient documentation

## 2019-05-01 DIAGNOSIS — Z79899 Other long term (current) drug therapy: Secondary | ICD-10-CM | POA: Diagnosis not present

## 2019-05-01 LAB — ANTITHROMBIN III: AntiThromb III Func: 117 % (ref 75–120)

## 2019-05-01 MED ORDER — HYDROCODONE-ACETAMINOPHEN 5-325 MG PO TABS: 1.0000 | ORAL_TABLET | Freq: Four times a day (QID) | ORAL | 0 refills | Status: AC | PRN

## 2019-05-01 MED ORDER — VALACYCLOVIR HCL 1 G PO TABS: 1000.0000 mg | ORAL_TABLET | Freq: Three times a day (TID) | ORAL | 0 refills | Status: AC

## 2019-05-01 NOTE — Progress Notes (Signed)
Pt appears to be very uncomfortable, c/o left flank pain radiating to left lower quadrant. Pt reports she was seen at urgent care and then in ER and told that she has multiple blood clots.

## 2019-05-02 LAB — PROTEIN S ACTIVITY: Protein S Activity: 87 % (ref 63–140)

## 2019-05-02 LAB — URINE CULTURE: Culture: <10000 — AB

## 2019-05-02 LAB — PROTEIN S, TOTAL: Protein S Ag, Total: 89 % (ref 60–150)

## 2019-05-02 LAB — LUPUS ANTICOAGULANT PANEL
DRVVT: 43.2 s (ref 0.0–47.0)
PTT Lupus Anticoagulant: 41.8 s (ref 0.0–51.9)

## 2019-05-02 LAB — PROTEIN C ACTIVITY: Protein C Activity: 161 % (ref 73–180)

## 2019-05-02 MED ORDER — APIXABAN 5 MG PO TABS: 5.0000 mg | ORAL_TABLET | Freq: Two times a day (BID) | ORAL | 5 refills | Status: AC

## 2019-05-02 NOTE — Progress Notes (Signed)
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  INTERVAL HISTORY: Patient returns to clinic today as an add-on with complaints of significant flank pain and newly diagnosed pulmonary embolism.  She is visibly uncomfortable.  Patient states she was in her normal state of health until this past Sunday when she had acute onset left flank pain which has become worse throughout the week.  Subsequent CT scan revealed multiple pulmonary emboli. She has no neurologic complaints. She denies any recent fevers or illnesses. She has a good appetite and denies weight loss. She has no chest pain, cough, hemoptysis, or shortness of breath. She denies any nausea, vomiting, constipation, or diarrhea. She has no urinary complaints.  Patient offers no further specific complaints today.  REVIEW OF SYSTEMS:   Review of Systems  Constitutional: Negative.  Negative for fever, malaise/fatigue and weight loss.  Respiratory: Negative.  Negative for cough and shortness of breath.   Cardiovascular: Negative.  Negative for chest pain and leg swelling.  Gastrointestinal: Negative.  Negative for abdominal pain and constipation.  Genitourinary: Positive for flank pain. Negative for dysuria.  Musculoskeletal: Negative for joint pain.  Skin: Negative.  Negative for rash.  Neurological: Negative.  Negative for sensory change, focal weakness and weakness.  Psychiatric/Behavioral: Negative.  The patient is not nervous/anxious.     As per HPI. Otherwise, a complete review of systems is negative.  PAST MEDICAL HISTORY: Past Medical History:  Diagnosis Date   Anxiety      Arthritis    Cancer ([place]) 08/30/13   Lung, tx removal of upper left lobe and chemotherapy   Chronic pain    pt states from lung surgery and knee pain   GERD (gastroesophageal reflux disease)    Hypertension    Insomnia    Secondary malignant neoplasm of hilus of lung with unknown primary site, unspecified laterality [facility])    Skin cancer     PAST SURGICAL HISTORY: Past Surgical History:  Procedure Laterality Date   ABDOMINAL HYSTERECTOMY     CESAREAN SECTION     CHOLECYSTECTOMY  01-02-14   COLONOSCOPY  05-13-14   Dr Bary Castilla   CYSTOSCOPY Bilateral 08/22/2017   Procedure: CYSTOSCOPY;  Surgeon: Will Bonnet, MD;  Location: [facility];  Service: Gynecology;  Laterality: Bilateral;   LAPAROSCOPIC SALPINGO OOPHERECTOMY Left 08/22/2017   Procedure: LAPAROSCOPIC SALPINGO OOPHORECTOMY;  Surgeon: Will Bonnet, MD;  Location: [facility];  Service: Gynecology;  Laterality: Left;   left shoulder blade Left 30 years ago   removed extra bone   NASAL TURBINATE REDUCTION Bilateral 01/08/2016   Procedure: TURBINATE REDUCTION/SUBMUCOSAL RESECTION;  Surgeon: [name], MD;  Location: [place];  Service: ENT;  Laterality: Bilateral;   SEPTOPLASTY Bilateral 01/08/2016   Procedure: SEPTOPLASTY;  Surgeon: [name], MD;  Location: [place];  Service: ENT;  Laterality: Bilateral;   upper lobectomy Left     FAMILY HISTORY: Family History  Problem Relation Age of Onset   Thyroid cancer Mother    Heart attack Father    Thyroid cancer Brother     ADVANCED DIRECTIVES (Y/N):  N  HEALTH MAINTENANCE: Social History   Tobacco Use   Smoking status: Current  Some Day Smoker    Packs/day: 0.50    Years: 35.00    Pack years: 17.50    Types: Cigarettes   Smokeless tobacco: Never Used  Substance Use Topics   Alcohol use: Yes    Alcohol/week: 0.0 standard drinks    Comment: rare   Drug use: No     Colonoscopy:  PAP:  Bone  density:  Lipid panel:  Allergies  Allergen Reactions   Seroquel [Quetiapine Fumarate] Other (See Comments)    hallunications    Current Outpatient Medications  Medication Sig Dispense Refill   albuterol (VENTOLIN HFA) 108 (90 Base) MCG/ACT inhaler TAKE TWO PUFFS EVERY 4 TO 6 HOURS AS NEEDED FOR SHORTNESS OF BREATH. 18 g 2   apixaban (ELIQUIS) 5 MG TABS tablet Take 2 tablets (10 mg total) by mouth 2 (two) times daily for 7 days. 28 tablet 0   furosemide (LASIX) 40 MG tablet Take 40 mg by mouth daily as needed (for fluid retention/swelling).     HYDROcodone-acetaminophen (NORCO) 5-325 MG tablet Take 1 tablet by mouth every 6 (six) hours as needed for up to 7 days for moderate pain. 30 tablet 0   ibuprofen (ADVIL,MOTRIN) 600 MG tablet Take 1 tablet (600 mg total) by mouth every 6 (six) hours as needed (breakthrough pain). 30 tablet 0   lisinopril (ZESTRIL) 10 MG tablet Take 10 mg by mouth daily.     nortriptyline (PAMELOR) 10 MG capsule Take 10 mg by mouth at bedtime.     omeprazole (PRILOSEC) 40 MG capsule Take 1 capsule (40 mg total) by mouth daily. 30 capsule 12   oxyCODONE (ROXICODONE) 15 MG immediate release tablet Take 15 mg by mouth 5 (five) times daily.     potassium chloride (K-DUR) 10 MEQ tablet Take 10 mEq by mouth daily as needed (with lasix use for retention/swelling).     tiZANidine (ZANAFLEX) 4 MG tablet Take 4 mg by mouth at bedtime.     valACYclovir (VALTREX) 1000 MG tablet Take 1 tablet (1,000 mg total) by mouth 3 (three) times daily. 7 tablet 0   Vitamin D, Ergocalciferol, (DRISDOL) 50000 units CAPS capsule Take 50,000 Units by mouth every Thursday.     No current facility-administered medications for this visit.    OBJECTIVE: Vitals:   05/01/19 1109  BP: (!) 159/88  Pulse: 96  Resp: 19  Temp: (!) 95.8 F (35.4 C)  SpO2: 96%     Body mass index is 39.6 kg/m.    ECOG FS:0 - Asymptomatic  General: Well-developed, well-nourished, mild distress  secondary to pain. Eyes: Pink conjunctiva, anicteric sclera. HEENT: Normocephalic, moist mucous membranes. Lungs: No audible wheezing or coughing. Heart: Regular rate and rhythm. Abdomen: Soft, nontender, no obvious distention. Musculoskeletal: No edema, cyanosis, or clubbing. Neuro: Alert, answering all questions appropriately. Cranial nerves grossly intact. Skin: Small vesicular rash in the lumbar region consistent with shingles. Psych: Normal affect.    LAB RESULTS:  Lab Results  Component Value Date   NA 142 04/30/2019   K 3.2 (L) 04/30/2019   CL 103 04/30/2019   CO2 29 04/30/2019   GLUCOSE 111 (H) 04/30/2019   BUN 17 04/30/2019   CREATININE 0.96 04/30/2019   CALCIUM 8.4 (L) 04/30/2019   PROT 7.7 04/30/2019   ALBUMIN 4.2 04/30/2019   AST 18 04/30/2019   ALT 23 04/30/2019   ALKPHOS 91 04/30/2019   BILITOT 0.3 04/30/2019   GFRNONAA >60 04/30/2019   GFRAA >60 04/30/2019    Lab Results  Component  Value Date   WBC 14.7 (H) 04/30/2019   NEUTROABS 6.8 07/06/2018   HGB 13.0 04/30/2019   HCT 40.4 04/30/2019   MCV 93.1 04/30/2019   PLT 306 04/30/2019     STUDIES: DG Chest 2 View  Result Date: 04/29/2019 CLINICAL DATA:  Left upper quadrant abdominal pain. History of left-sided lung cancer. EXAM: CHEST - 2 VIEW COMPARISON:  Stark 27, 2017. FINDINGS: The heart size and mediastinal contours are within normal limits. Both lungs are clear. No pneumothorax or pleural effusion is noted. Old left rib fractures are noted. IMPRESSION: No active cardiopulmonary disease. Electronically Signed   By: Marijo Conception M.D.   On: 04/29/2019 14:34   DG Abd 1 View  Result Date: 04/29/2019 CLINICAL DATA:  Acute left flank pain. EXAM: ABDOMEN - 1 VIEW COMPARISON:  None. FINDINGS: The bowel gas pattern is normal. Status post cholecystectomy. No definite evidence of nephrolithiasis. Several rounded calcifications are noted in the pelvis most consistent with phleboliths. IMPRESSION: No  definite evidence of nephrolithiasis. No evidence of bowel obstruction or ileus. Electronically Signed   By: Marijo Conception M.D.   On: 04/29/2019 14:36   CT Renal Stone Study  Result Date: 04/30/2019 CLINICAL DATA:  Left flank pain since Sunday morning no history of urolithiasis EXAM: CT ABDOMEN AND PELVIS WITHOUT CONTRAST TECHNIQUE: Multidetector CT imaging of the abdomen and pelvis was performed following the standard protocol without IV contrast. COMPARISON:  CT chest Oct 18, 2018, CT abdomen pelvis Stark 16, 2017 FINDINGS: Lower chest: Bandlike areas of scarring and/or atelectasis in the left lung base. Lung bases otherwise clear. Normal heart size. No pericardial effusion. Hepatobiliary: No focal liver abnormality is seen. Patient is post cholecystectomy. Slight prominence of the biliary tree likely related to reservoir effect. No calcified intraductal gallstones. Pancreas: Unremarkable. No pancreatic ductal dilatation or surrounding inflammatory changes. Spleen: Normal in size without focal abnormality. Adrenals/Urinary Tract: Normal adrenal glands. Stable left upper pole renal cyst. Stable fat attenuation lesion in the right lower pole likely angiomyolipoma. No worrisome renal lesions. No visible urolithiasis or hydronephrosis. Urinary bladder is largely decompressed at the time of exam and therefore poorly evaluated by CT imaging. No bladder calculi or calcifications along the expected course of the ureter. Stomach/Bowel: Distal esophagus, stomach and duodenal sweep are unremarkable. No small bowel wall thickening or dilatation. No evidence of obstruction. A normal appendix is visualized. No colonic dilatation or wall thickening. Scattered colonic diverticula without focal pericolonic inflammation to suggest diverticulitis. Vascular/Lymphatic: Atherosclerotic plaque within the normal caliber aorta. No suspicious or enlarged lymph nodes in the included lymphatic chains. Reproductive: Uterus is  surgically absent. No concerning adnexal lesions. Other: Postsurgical changes from prior low transverse abdominal incision. No bowel containing hernias. No free air or free fluid in the abdomen or pelvis. No organized collection or abscess. Musculoskeletal: Partial lumbarization of the L5 vertebra. Mild grade 1 anterolisthesis L4 on L5. Multilevel degenerative changes are present in the imaged portions of the spine. No acute osseous abnormality or suspicious osseous lesion. IMPRESSION: 1. No acute CT findings to account for left flank pain. Specifically, no visible urolithiasis or hydronephrosis. 2. Stable left upper pole renal cyst. Stable right lower pole angiomyolipoma. 3. Colonic diverticulosis without evidence of diverticulitis. 4. Aortic Atherosclerosis (ICD10-I70.0). Electronically Signed   By: Lovena Le M.D.   On: 04/30/2019 18:53   CT Angio Chest/Abd/Pel for Dissection W and/or Wo Contrast  Result Date: 04/30/2019 CLINICAL DATA:  Left-sided pain for 2 days EXAM: CT ANGIOGRAPHY  CHEST, ABDOMEN AND PELVIS TECHNIQUE: Multidetector CT imaging through the chest, abdomen and pelvis was performed using the standard protocol during bolus administration of intravenous contrast. Multiplanar reconstructed images and MIPs were obtained and reviewed to evaluate the vascular anatomy. CONTRAST:  187m OMNIPAQUE IOHEXOL 350 MG/ML SOLN COMPARISON:  CT of the abdomen and pelvis from earlier in the same day as well as a CT of the chest from 10/14/2018 FINDINGS: CTA CHEST FINDINGS Cardiovascular: Initial precontrast images demonstrate no hyperdense crescent within aortic wall. Post-contrast images demonstrate normal enhancement of the thoracic aorta. Minimal atherosclerotic changes are noted. No aneurysmal dilatation or dissection is seen. Normal branching pattern of the aorta is noted. The pulmonary artery is opacified but not timed for embolus evaluation. There is however evidence of filling defects in the right upper  and right lower lobe consistent with acute pulmonary emboli. No right heart strain is seen. Mediastinum/Nodes: Thoracic inlet demonstrates a hypodense lesion within the left lobe of the thyroid measuring less than 1 cm. This is stable from the prior CT of the chest. Previously seen mediastinal adenopathy is again identified but decreased in size. These were likely reactive in nature. No sizable adenopathy is seen. The esophagus as visualized is within normal limits. Lungs/Pleura: Lungs demonstrate evidence of prior left upper lobectomy. Scarring is seen stable from the previous exam. No focal infiltrate or sizable effusion is noted. Stable nodules are noted within the right lung. These are best seen on image number 26 of series 6 and image number 79 of series 6. These are slightly less prominent than that noted on the prior exam. Given their long-term stability since 2017 there felt to be benign in etiology. No new nodules are seen. Musculoskeletal: Degenerative changes of the thoracic spine are noted. Postsurgical changes in the left ribcage are seen consistent with the given clinical history. Review of the MIP images confirms the above findings. CTA ABDOMEN AND PELVIS FINDINGS VASCULAR Aorta: Abdominal aorta demonstrates minimal atherosclerotic change. No aneurysmal dilatation is noted. Celiac: Patent without evidence of aneurysm, dissection, vasculitis or significant stenosis. SMA: Patent without evidence of aneurysm, dissection, vasculitis or significant stenosis. Renals: Single renal arteries are identified bilaterally. No focal stenosis or aneurysmal dilatation is noted. IMA: Patent without evidence of aneurysm, dissection, vasculitis or significant stenosis. Iliacs: Patent without evidence of aneurysm, dissection, vasculitis or significant stenosis. Veins: No specific venous abnormality is noted. Review of the MIP images confirms the above findings. NON-VASCULAR Hepatobiliary: No focal liver abnormality is  seen. Status post cholecystectomy. No biliary dilatation. Pancreas: Unremarkable. No pancreatic ductal dilatation or surrounding inflammatory changes. Spleen: Normal in size without focal abnormality. Adrenals/Urinary Tract: Adrenal glands are within normal limits. Kidneys demonstrate a normal enhancement pattern. No renal calculi or urinary tract obstructive changes are seen a small cyst is noted in the upper pole of the left kidney measuring 1 cm. No obstructive changes are noted. The bladder is partially distended. Stomach/Bowel: Scattered diverticular change of the colon is noted without evidence of diverticulitis. No obstructive or inflammatory changes are seen. The appendix is within normal limits. No small bowel abnormality is noted. The stomach is unremarkable as well. Lymphatic: No focal lymphadenopathy is identified. Reproductive: Status post hysterectomy. No adnexal masses. Other: No abdominal wall hernia or abnormality. No abdominopelvic ascites. Musculoskeletal: Mild degenerative changes of the lumbar spine are noted. No compression deformities are seen. Review of the MIP images confirms the above findings. IMPRESSION: CTA of the chest: No evidence of aneurysmal dilatation or dissection. Small peripheral  pulmonary emboli are noted in the right upper lobe and right lower lobe without evidence of right heart strain. Stable nodules dating back to 2017 consistent with benign etiology. Changes of left upper lobectomy without evidence of recurrence. Small less than 1 cm nodule in the left lobe of the thyroid. No followup recommended (ref: J Am Coll Radiol. 2015 Feb;12(2): 143-50). CTA of the abdomen and pelvis: Normal appearing aorta. Diverticulosis without evidence of diverticulitis. No acute abnormality is noted. Critical Value/emergent results were called by telephone at the time of interpretation on 04/30/2019 at 9:02 pm to Wess Botts, MD , who verbally acknowledged these results. Electronically  Signed   By: Inez Catalina M.D.   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  PLAN:    1. 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 18 lymph nodes were negative. EGFR and ALK were negative as well. She subsequently received 4 cycles of adjuvant cisplatin and Alimta between May and August 2015. She did not receive maintenance Alimta.  Patient's most recent CT scan on Oct 18, 2018 revealed no evidence of progressive or recurrent disease.  She was noted to have a sub-solid 7 mm right lower lobe pulmonary nodule that has been stable since 2017.  Patient has been instructed to keep her previously scheduled follow-up appointments for repeat imaging and evaluation. 2.  Pain/rash: Patient's left flank pain appears to be dermatomal and on the opposite side of her incidentally found pulmonary embolism.  Rash appears consistent with shingles.  Patient was given a prescription for Valtrex 1000 mg 3 times daily x7 days as well as a short course of Vicodin. 3.  Pulmonary embolism: Appears to be incidental and unrelated to her pain.  We will do full hypercoagulable work-up today for completeness. Patient will complete her loading dose of 10 mg Eliquis twice per day at the end of the week.  She was given a prescription for 5 mg twice per day to complete 6 months of treatment.  Return to clinic in 1 month for further evaluation.    Patient expressed understanding and was in agreement with this plan. She also understands that She can call clinic at any time with any questions, concerns, or complaints.   Cancer Staging Lung cancer [facility]) Staging form: Lung, AJCC 7th Edition - Clinical: Stage IIB (yT3, N0, M0) - Unsigned   Lloyd Huger, MD   05/02/2019 1:14 PM

## 2019-05-03 LAB — BETA-2-GLYCOPROTEIN I ABS, IGG/M/A
Beta-2 Glyco I IgG: <9 GPI IgG units (ref 0–20)
Beta-2-Glycoprotein I IgA: <9 GPI IgA units (ref 0–25)
Beta-2-Glycoprotein I IgM: 21 GPI IgM units (ref 0–32)

## 2019-05-03 LAB — CARDIOLIPIN ANTIBODIES, IGG, IGM, IGA
Anticardiolipin IgA: <9 APL U/mL (ref 0–11)
Anticardiolipin IgG: <9 GPL U/mL (ref 0–14)
Anticardiolipin IgM: 25 MPL U/mL — ABNORMAL HIGH (ref 0–12)

## 2019-05-03 LAB — PROTEIN C, TOTAL: Protein C, Total: 125 % (ref 60–150)

## 2019-05-05 ENCOUNTER — Other Ambulatory Visit: Payer: Self-pay

## 2019-05-05 ENCOUNTER — Encounter: Payer: Self-pay | Admitting: Emergency Medicine

## 2019-05-05 ENCOUNTER — Ambulatory Visit
Admission: EM | Admit: 2019-05-05 | Discharge: 2019-05-05 | Disposition: A | Discharge status: Home / Self Care | Payer: Medicaid Other | Attending: Family Medicine | Admitting: Family Medicine

## 2019-05-05 ENCOUNTER — Telehealth: Payer: Self-pay | Admitting: Oncology

## 2019-05-05 DIAGNOSIS — B029 Zoster without complications: Secondary | ICD-10-CM

## 2019-05-05 NOTE — Discharge Instructions (Signed)
Go to the ER if you need to for your pain.  Continue home pain medications.  Take care  Dr. Lacinda Axon

## 2019-05-05 NOTE — ED Triage Notes (Addendum)
Patient c/o left sided lower back pain and rash on her lower back that started on Thursday.  Patient denies fevers.

## 2019-05-05 NOTE — ED Notes (Signed)
Patient states that she was seen at the ED on 04/30/19 and was seen by her Oncologist on 05/01/19.  Patient is on Valtrex for Shingles.

## 2019-05-05 NOTE — Telephone Encounter (Signed)
Patient called and reports that he continues to have a lot of pain at the area of shingles.  She has tried lidocaine patch and also tried Norco 5/325 every 4-6 hours without any relief. I suggest patient to go to urgent care for further evaluation.  I discussed with patient that due to the institution policy, I cannot prescribe her controlled substance as a covering doctor on-call during the of hours.  Recommend patient to contact Dr. Grayland Ormond on Monday, 05/06/2019 for refills.  Patient appreciates the advice.

## 2019-05-06 LAB — FACTOR 5 LEIDEN

## 2019-05-06 LAB — PROTHROMBIN GENE MUTATION

## 2019-05-06 NOTE — ED Provider Notes (Signed)
[facility]    CSN: [id number] Arrival date & time: 05/05/19  1512  History   Chief Complaint Chief Complaint  Patient presents with  . Rash   HPI   58 year old female presents with rash and severe pain.  Patient has known chronic pain.  She is on chronic narcotics.  Patient saw her oncologist recently and had a rash.  She states that the rash started on Thursday.  She was seen by oncology and diagnosed with shingles.  She was placed on Valtrex.  She was given pain medication.  Patient presents today reporting continued rash and severe pain.  She localizes the pain to the low back where the rash is located and states that it radiates around the left side towards the front of her abdomen/groin.  Patient rates her pain as 10/10 in severity.  No relieving factors.  No known exacerbating factors.  Patient is very uncomfortable and is requesting pain medication today.  PMH, Surgical Hx, Family Hx, Social History reviewed and updated as below.  Past Medical History:  Diagnosis Date  . Anxiety   . Arthritis   . Cancer ([place]) 08/30/13   Lung, tx removal of upper left lobe and chemotherapy  . Chronic pain    pt states from lung surgery and knee pain  . GERD (gastroesophageal reflux disease)   . Hypertension   . Insomnia   . 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 Skin cancer     Patient Active Problem List   Diagnosis Date Noted  . Left ovarian cyst 08/09/2017  . Cough 06/05/2016  . Bronchitis 06/05/2016  . Edema extremities 04/05/2016  . Migraine 10/05/2015  . Chronic nasal congestion 07/09/2015  . Palpitations 03/02/2015  . Chest pain 03/02/2015  . Menopausal flushing 12/09/2014  . Hypertension 11/04/2014  . Insomnia   . Diarrhea 04/25/2014  . Encounter for screening colonoscopy 04/25/2014  . Cyst of skin 03/11/2014  . Postprandial diarrhea 03/11/2014  . Indigestion 01/10/2014  . Gallstones  12/18/2013  . Lung cancer ([place]) 12/18/2013    Past Surgical History:  Procedure Laterality Date  . ABDOMINAL HYSTERECTOMY    . CESAREAN SECTION    . CHOLECYSTECTOMY  01-02-14  . COLONOSCOPY  05-13-14   Dr Bary Castilla  . CYSTOSCOPY Bilateral 08/22/2017   Procedure: CYSTOSCOPY;  Surgeon: Will Bonnet, MD;  Location: [facility];  Service: Gynecology;  Laterality: Bilateral;  . LAPAROSCOPIC SALPINGO OOPHERECTOMY Left 08/22/2017   Procedure: LAPAROSCOPIC SALPINGO OOPHORECTOMY;  Surgeon: Will Bonnet, MD;  Location: [facility];  Service: Gynecology;  Laterality: Left;  . left shoulder blade Left 30 years ago   removed extra bone  . NASAL TURBINATE REDUCTION Bilateral 01/08/2016   Procedure: TURBINATE REDUCTION/SUBMUCOSAL RESECTION;  Surgeon: [name], MD;  Location: [place];  Service: ENT;  Laterality: Bilateral;  . SEPTOPLASTY Bilateral 01/08/2016   Procedure: SEPTOPLASTY;  Surgeon: [name], MD;  Location: [place];  Service: ENT;  Laterality: Bilateral;  . upper lobectomy Left     OB History    Gravida  5   Para  3   Term  3   Preterm      AB  2   Living  3     SAB      TAB      Ectopic      Multiple      Live Births  3  Obstetric Comments  Menstrual age: 54  Age 1st Pregnancy: 28         Home Medications    Prior to Admission medications   Medication Sig Start Date End Date Taking? Authorizing Provider  apixaban (ELIQUIS) 5 MG TABS tablet Take 2 tablets (10 mg total) by mouth 2 (two) times daily for 7 days. 04/30/19 05/07/19 Yes Vanessa [place], MD  furosemide (LASIX) 40 MG tablet Take 40 mg by mouth daily as needed (for fluid retention/swelling).   Yes [provider]  HYDROcodone-acetaminophen (NORCO) 5-325 MG tablet Take 1 tablet by mouth every 6 (six) hours as needed for up to 7 days for moderate pain. 05/01/19 05/08/19 Yes Lloyd Huger, MD  lisinopril (ZESTRIL) 10 MG tablet Take 10 mg by mouth  daily.   Yes [provider]  nortriptyline (PAMELOR) 10 MG capsule Take 10 mg by mouth at bedtime.   Yes [provider]  omeprazole (PRILOSEC) 40 MG capsule Take 1 capsule (40 mg total) by mouth daily. 12/09/14  Yes Crissman, Jeannette How, MD  oxyCODONE (ROXICODONE) 15 MG immediate release tablet Take 15 mg by mouth 5 (five) times daily.   Yes [provider]  potassium chloride (K-DUR) 10 MEQ tablet Take 10 mEq by mouth daily as needed (with lasix use for retention/swelling).   Yes [provider]  tiZANidine (ZANAFLEX) 4 MG tablet Take 4 mg by mouth at bedtime.   Yes [provider]  valACYclovir (VALTREX) 1000 MG tablet Take 1 tablet (1,000 mg total) by mouth 3 (three) times daily. 05/01/19  Yes Lloyd Huger, MD  Vitamin D, Ergocalciferol, (DRISDOL) 50000 units CAPS capsule Take 50,000 Units by mouth every Thursday.   Yes [provider]  albuterol (VENTOLIN HFA) 108 (90 Base) MCG/ACT inhaler TAKE TWO PUFFS EVERY 4 TO 6 HOURS AS NEEDED FOR SHORTNESS OF BREATH. 07/09/15   Guadalupe Maple, MD  apixaban (ELIQUIS) 5 MG TABS tablet Take 1 tablet (5 mg total) by mouth 2 (two) times daily. 05/02/19   Lloyd Huger, MD  ibuprofen (ADVIL,MOTRIN) 600 MG tablet Take 1 tablet (600 mg total) by mouth every 6 (six) hours as needed (breakthrough pain). 08/22/17   Will Bonnet, MD    Family History Family History  Problem Relation Age of Onset  . Thyroid cancer Mother   . Heart attack Father   . Thyroid cancer Brother     Social History Social History   Tobacco Use  . Smoking status: Current Some Day Smoker    Packs/day: 0.50    Years: 35.00    Pack years: 17.50    Types: Cigarettes  . Smokeless tobacco: Never Used  Substance Use Topics  . Alcohol use: Yes    Alcohol/week: 0.0 standard drinks    Comment: rare  . Drug use: No     Allergies   Seroquel [quetiapine fumarate]   Review of Systems Review of Systems   Musculoskeletal: Positive for back pain.  Skin: Positive for rash.   Physical Exam Triage Vital Signs ED Triage Vitals  Enc Vitals Group     BP 05/05/19 1526 (!) 182/90     Pulse Rate 05/05/19 1526 (!) 102     Resp 05/05/19 1526 16     Temp 05/05/19 1526 98.4 F (36.9 C)     Temp Source 05/05/19 1526 Oral     SpO2 05/05/19 1526 100 %     Weight 05/05/19 1522 204 lb (92.5 kg)  Height 05/05/19 1522 5\' 3"  (1.6 m)     Head Circumference --      Peak Flow --      Pain Score 05/05/19 1522 10     Pain Loc --      Pain Edu? --      Excl. in [place]? --    Updated Vital Signs BP (!) 182/90 (BP Location: Right Arm)   Pulse (!) 102   Temp 98.4 F (36.9 C) (Oral)   Resp 16   Ht 5\' 3"  (1.6 m)   Wt 92.5 kg   SpO2 100%   BMI 36.14 kg/m   Visual Acuity Right Eye Distance:   Left Eye Distance:   Bilateral Distance:    Right Eye Near:   Left Eye Near:    Bilateral Near:     Physical Exam Vitals and nursing note reviewed.  Constitutional:      Appearance: She is obese.     Comments: Patient bending over on the exam table.  Appears in pain.  HENT:     Head: Normocephalic and atraumatic.  Eyes:     General:        Right eye: No discharge.        Left eye: No discharge.     Conjunctiva/sclera: Conjunctivae normal.  Pulmonary:     Effort: Pulmonary effort is normal. No respiratory distress.  Musculoskeletal:     Comments: Patient with tenderness of the lower lumbar spine.  Skin:    Comments: Patient has a vesicular rash in the midline of the lower lumbar spine.  Appears consistent with shingles.  Neurological:     Mental Status: She is alert.    UC Treatments / Results  Labs (all labs ordered are listed, but only abnormal results are displayed) Labs Reviewed - No data to display  EKG   Radiology No results found.  Procedures Procedures (including critical care time)  Medications Ordered in UC Medications - No data to display  Initial Impression / Assessment  and Plan / UC Course  I have reviewed the triage vital signs and the nursing notes.  Pertinent labs & imaging results that were available during my care of the patient were reviewed by me and considered in my medical decision making (see chart for details).    58 year old female presents with herpes zoster and associated severe pain.  Patient requesting narcotic pain medications today.  [place] controlled substance database reviewed.  Patient has received 150 tablets of oxycodone 15 mg on 12/4.  She has also received hydrocodone 5/325, 30 tablets on 12/9.  I have advised her that I cannot give her additional narcotic pain medications.  She cannot use NSAIDs due to current use of Eliquis.  She has tried Lidoderm patches per her report without improvement.  I offered a corticosteroid shot and patient declined.  Patient was displeased that I would not give her additional narcotic pain medication.  I have advised her that this is not safe.  I have advised her to go to the hospital if her pain is uncontrolled, as I do not have anything else I can give her at this time.  I do not think gabapentin is a good option either due to the sedative nature of the drug especially in the setting of chronic narcotic use.  Final Clinical Impressions(s) / UC Diagnoses   Final diagnoses:  Herpes zoster without complication     Discharge Instructions     Go to the ER if you  need to for your pain.  Continue home pain medications.  Take care  Dr. Lacinda Axon    ED Prescriptions    None     PDMP not reviewed this encounter.   Coral Spikes, [place] 05/06/19 1135

## 2019-05-24 NOTE — Progress Notes (Signed)
[place]  [phone] Fax:(336) [phone]  ID: Natalie Stark OB: July 08, 1960  MR#: [id number]  QIW#:[id number]  Patient Care Team: Pa, [facility] as PCP - General Forest Gleason, MD (Inactive) (Unknown Physician Specialty) Robert Bellow, MD (General Surgery)   I connected with Natalie Stark on 05/31/19 at  2:30 PM EST by video enabled telemedicine visit and verified that I am speaking with the correct person using two identifiers.   I discussed the limitations, risks, security and privacy concerns of performing an evaluation and management service by telemedicine and the availability of in-person appointments. I also discussed with the patient that there may be a patient responsible charge related to this service. The patient expressed understanding and agreed to proceed.   Other persons participating in the visit and their role in the encounter: Patient, MD.  Patient's location: Home. Provider's location: Clinic.  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  INTERVAL HISTORY: Patient agreed to video enabled telemedicine visit for further evaluation and discussion of her laboratory work.  She is fully recovered from shingles and denies any further rash or pain.  She is tolerating Eliquis well without significant side effects.  She has no neurologic complaints. She denies any recent fevers or illnesses. She has a good appetite and denies weight loss. She has no chest pain, cough, hemoptysis, or shortness of breath. She denies any nausea, vomiting, constipation, or diarrhea. She has no urinary complaints.  Patient offers no specific complaints today.  REVIEW OF SYSTEMS:   Review of Systems  Constitutional: Negative.  Negative for fever, malaise/fatigue and weight loss.  Respiratory: Negative.  Negative for cough and shortness of breath.   Cardiovascular: Negative.  Negative for chest  pain and leg swelling.  Gastrointestinal: Negative.  Negative for abdominal pain and constipation.  Genitourinary: Negative.  Negative for dysuria and flank pain.  Musculoskeletal: Negative for joint pain.  Skin: Negative.  Negative for rash.  Neurological: Negative.  Negative for sensory change, focal weakness and weakness.  Psychiatric/Behavioral: Negative.  The patient is not nervous/anxious.     As per HPI. Otherwise, a complete review of systems is negative.  PAST MEDICAL HISTORY: Past Medical History:  Diagnosis Date  . Anxiety   . Arthritis   . Cancer ([place]) 08/30/13   Lung, tx removal of upper left lobe and chemotherapy  . Chronic pain    pt states from lung surgery and knee pain  . GERD (gastroesophageal reflux disease)   . Hypertension   . Insomnia   . 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 Skin cancer     PAST SURGICAL HISTORY: Past Surgical History:  Procedure Laterality Date  . ABDOMINAL HYSTERECTOMY    . CESAREAN SECTION    . CHOLECYSTECTOMY  01-02-14  . COLONOSCOPY  05-13-14   Dr Bary Castilla  . CYSTOSCOPY Bilateral 08/22/2017   Procedure: CYSTOSCOPY;  Surgeon: Will Bonnet, MD;  Location: [facility];  Service: Gynecology;  Laterality: Bilateral;  . LAPAROSCOPIC SALPINGO OOPHERECTOMY Left 08/22/2017   Procedure: LAPAROSCOPIC SALPINGO OOPHORECTOMY;  Surgeon: Will Bonnet, MD;  Location: [facility];  Service: Gynecology;  Laterality: Left;  . left shoulder blade Left 30 years ago   removed extra bone  . NASAL TURBINATE REDUCTION Bilateral 01/08/2016   Procedure: TURBINATE REDUCTION/SUBMUCOSAL RESECTION;  Surgeon: [name], MD;  Location: [place];  Service: ENT;  Laterality:  Bilateral;  . SEPTOPLASTY Bilateral 01/08/2016   Procedure: SEPTOPLASTY;  Surgeon: [name], MD;  Location: [place];  Service: ENT;  Laterality: Bilateral;  . upper lobectomy Left      FAMILY HISTORY: Family History  Problem Relation Age of Onset  . Thyroid cancer Mother   . Heart attack Father   . Thyroid cancer Brother     ADVANCED DIRECTIVES (Y/N):  N  HEALTH MAINTENANCE: Social History   Tobacco Use  . Smoking status: Current Some Day Smoker    Packs/day: 0.50    Years: 35.00    Pack years: 17.50    Types: Cigarettes  . Smokeless tobacco: Never Used  Substance Use Topics  . Alcohol use: Yes    Alcohol/week: 0.0 standard drinks    Comment: rare  . Drug use: No     Colonoscopy:  PAP:  Bone density:  Lipid panel:  Allergies  Allergen Reactions  . Pregabalin Diarrhea, Other (See Comments) and Swelling  . Seroquel [Quetiapine Fumarate] Other (See Comments)    hallunications    Current Outpatient Medications  Medication Sig Dispense Refill  . albuterol (VENTOLIN HFA) 108 (90 Base) MCG/ACT inhaler TAKE TWO PUFFS EVERY 4 TO 6 HOURS AS NEEDED FOR SHORTNESS OF BREATH. 18 g 2  . apixaban (ELIQUIS) 5 MG TABS tablet Take 1 tablet (5 mg total) by mouth 2 (two) times daily. 60 tablet 5  . buPROPion (WELLBUTRIN SR) 100 MG 12 hr tablet Take 100 mg by mouth daily.    . diclofenac (FLECTOR) 1.3 % PTCH Flector 1.3 % transdermal 12 hour patch  Apply 1 patch twice a day by transdermal route as needed for 30 days.    . furosemide (LASIX) 40 MG tablet Take 40 mg by mouth daily as needed (for fluid retention/swelling).    Marland Kitchen ibuprofen (ADVIL,MOTRIN) 600 MG tablet Take 1 tablet (600 mg total) by mouth every 6 (six) hours as needed (breakthrough pain). 30 tablet 0  . lisinopril (ZESTRIL) 10 MG tablet Take 10 mg by mouth daily.    . nortriptyline (PAMELOR) 10 MG capsule Take 10 mg by mouth at bedtime.    Marland Kitchen omeprazole (PRILOSEC) 40 MG capsule Take 1 capsule (40 mg total) by mouth daily. 30 capsule 12  . oxyCODONE (ROXICODONE) 15 MG immediate release tablet Take 15 mg by mouth 5 (five) times daily.    . potassium chloride (K-DUR) 10 MEQ tablet Take 10 mEq by mouth  daily as needed (with lasix use for retention/swelling).    . pyridoxine (B-6) 100 MG tablet Vitamin B-6 100 mg tablet  Take 1 tablet twice a day by oral route for 42 days.    Marland Kitchen scopolamine (TRANSDERM-SCOP, 1.5 MG,) 1 MG/3DAYS Transderm-Scop 1.5 mg transdermal patch (1 mg over 3 days)  APPLY 1 PATCH TO SKIN AS NEEDED EVERY 3 DAYS.    Marland Kitchen tiZANidine (ZANAFLEX) 4 MG tablet Take 4 mg by mouth at bedtime.    . valACYclovir (VALTREX) 1000 MG tablet Take 1 tablet (1,000 mg total) by mouth 3 (three) times daily. 7 tablet 0  . Vitamin D, Ergocalciferol, (DRISDOL) 50000 units CAPS capsule Take 50,000 Units by mouth every Thursday.    Marland Kitchen apixaban (ELIQUIS) 5 MG TABS tablet Take 2 tablets (10 mg total) by mouth 2 (two) times daily for 7 days. 28 tablet 0   No current facility-administered medications for this visit.    OBJECTIVE: There were no vitals filed for this visit.   There is no height or  weight on file to calculate BMI.    ECOG FS:0 - Asymptomatic  General: Well-developed, well-nourished, no acute distress. HEENT: Normocephalic. Neuro: Alert, answering all questions appropriately. Cranial nerves grossly intact. Psych: Normal affect.  LAB RESULTS:  Lab Results  Component Value Date   NA 142 04/30/2019   K 3.2 (L) 04/30/2019   CL 103 04/30/2019   CO2 29 04/30/2019   GLUCOSE 111 (H) 04/30/2019   BUN 17 04/30/2019   CREATININE 0.96 04/30/2019   CALCIUM 8.4 (L) 04/30/2019   PROT 7.7 04/30/2019   ALBUMIN 4.2 04/30/2019   AST 18 04/30/2019   ALT 23 04/30/2019   ALKPHOS 91 04/30/2019   BILITOT 0.3 04/30/2019   GFRNONAA >60 04/30/2019   GFRAA >60 04/30/2019    Lab Results  Component Value Date   WBC 14.7 (H) 04/30/2019   NEUTROABS 6.8 07/06/2018   HGB 13.0 04/30/2019   HCT 40.4 04/30/2019   MCV 93.1 04/30/2019   PLT 306 04/30/2019     STUDIES: No results found.  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  PLAN:    1. 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 18 lymph nodes were negative. EGFR and ALK were negative as well. She subsequently received 4 cycles of adjuvant cisplatin and Alimta between May and August 2015. She did not receive maintenance Alimta.  Patient's most recent CT scan on Oct 18, 2018 revealed no evidence of progressive or recurrent disease.  She was noted to have a sub-solid 7 mm right lower lobe pulmonary nodule that has been stable since 2017.  Patient has been instructed to keep her previously scheduled follow-up appointment for CT scan and further evaluation in June 2021.   2.  Pain/rash: Secondary to shingles.  Resolved.   3.  Pulmonary embolism: Incidental and unrelated to shingle pain.  Full hypercoagulable work-up was negative.  No further intervention is needed at this time.  Patient will require 6 months of anticoagulation and then likely can discontinue.  She has been instructed to continue Eliquis until her next clinic appointment in June 2021.    I provided 25 minutes of face-to-face video visit time during this encounter which included chart review. Greater than 50% was spent counseling as documented under my assessment & plan.   Patient expressed understanding and was in agreement with this plan. She also understands that She can call clinic at any time with any questions, concerns, or complaints.   Cancer Staging Lung cancer [facility]) Staging form: Lung, AJCC 7th Edition - Clinical: Stage IIB (yT3, N0, M0) - Unsigned   Lloyd Huger, MD   05/31/2019 4:54 PM

## 2019-05-31 ENCOUNTER — Encounter: Payer: Self-pay | Admitting: Oncology

## 2019-05-31 ENCOUNTER — Inpatient Hospital Stay: Payer: Medicaid Other | Attending: Oncology | Admitting: Oncology

## 2019-05-31 DIAGNOSIS — C3402 Malignant neoplasm of left main bronchus: Secondary | ICD-10-CM

## 2019-05-31 DIAGNOSIS — I2699 Other pulmonary embolism without acute cor pulmonale: Secondary | ICD-10-CM

## 2019-10-25 ENCOUNTER — Ambulatory Visit
Admission: RE | Admit: 2019-10-25 | Discharge: 2019-10-25 | Disposition: A | Discharge status: Home / Self Care | Payer: Medicaid Other | Source: Ambulatory Visit | Attending: Oncology | Admitting: Oncology

## 2019-10-25 DIAGNOSIS — C3402 Malignant neoplasm of left main bronchus: Secondary | ICD-10-CM | POA: Diagnosis present

## 2019-10-25 LAB — POCT I-STAT CREATININE: Creatinine, Ser: 1 mg/dL (ref 0.44–1.00)

## 2019-10-25 MED ORDER — IOHEXOL 300 MG/ML  SOLN
75.0000 mL | Freq: Once | INTRAMUSCULAR | Status: AC | PRN
  Administered 2019-10-25: 75 mL via INTRAVENOUS

## 2019-10-25 NOTE — Progress Notes (Signed)
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  INTERVAL HISTORY: Patient returns to clinic today for 87-monthevaluation and discussion of her imaging results.  She currently feels well and is asymptomatic.  She has no neurologic complaints. She denies any recent fevers or illnesses. She has a good appetite and denies weight loss. She has no chest pain, cough, hemoptysis, or shortness of breath. She denies any nausea, vomiting, constipation, or diarrhea. She has no urinary complaints.  Patient feels at her baseline offers no specific complaints today.  REVIEW OF SYSTEMS:   Review of Systems  Constitutional: Negative.  Negative for fever, malaise/fatigue and weight loss.  Respiratory: Negative.  Negative for cough and shortness of breath.   Cardiovascular: Negative.  Negative for chest pain and leg swelling.  Gastrointestinal: Negative.  Negative for abdominal pain and constipation.  Genitourinary: Negative.  Negative for dysuria and flank pain.  Musculoskeletal: Negative for joint pain.  Skin: Negative.  Negative for rash.  Neurological: Negative.  Negative for sensory change, focal weakness and weakness.  Psychiatric/Behavioral: Negative.  The patient is not nervous/anxious.     As per HPI. Otherwise, a complete review of systems is negative.  PAST MEDICAL HISTORY: Past Medical History:  Diagnosis Date  . Anxiety   . Arthritis   . [place]   Lung, tx removal of upper left lobe and chemotherapy  . Chronic pain    pt states from lung surgery and knee pain  . GERD (gastroesophageal reflux disease)   . Hypertension    . Insomnia   . Secondary malignant neoplasm of hilus of lung with unknown primary site, unspecified laterality [place]   . Skin cancer     PAST SURGICAL HISTORY: Past Surgical History:  Procedure Laterality Date  . ABDOMINAL HYSTERECTOMY    . CESAREAN SECTION    . CHOLECYSTECTOMY  01-02-14  . COLONOSCOPY  05-13-14   Dr BBary Castilla . CYSTOSCOPY Bilateral 08/22/2017   Procedure: CYSTOSCOPY;  Surgeon: JWill Bonnet MD;  Location: [facility] ORS;  Service: Gynecology;  Laterality: Bilateral;  . LAPAROSCOPIC SALPINGO OOPHERECTOMY Left 08/22/2017   Procedure: LAPAROSCOPIC SALPINGO OOPHORECTOMY;  Surgeon: JWill Bonnet MD;  Location: [facility] ORS;  Service: Gynecology;  Laterality: Left;  . left shoulder blade Left 30 years ago   removed extra bone  . NASAL TURBINATE REDUCTION Bilateral 01/08/2016   Procedure: TURBINATE REDUCTION/SUBMUCOSAL RESECTION;  Surgeon: CBeverly Gust MD;  [place] [facility]  Service: ENT;  Laterality: Bilateral;  . SEPTOPLASTY Bilateral 01/08/2016   Procedure: SEPTOPLASTY;  Surgeon: CBeverly Gust MD;  [place] [facility]  Service: ENT;  Laterality: Bilateral;  . upper lobectomy Left     FAMILY HISTORY: Family History  Problem Relation Age of Onset  . Thyroid cancer Mother   . Heart attack Father   . Thyroid cancer Brother     ADVANCED DIRECTIVES (Y/N):  N  HEALTH MAINTENANCE: Social History   Tobacco Use  . Smoking status: Current Some Day Smoker    Packs/day: 0.50    Years: 35.00    Pack years: 17.50    Types: Cigarettes  . Smokeless tobacco: Never Used  Substance Use Topics  .  Alcohol use: Yes    Alcohol/week: 0.0 standard drinks    Comment: rare  . Drug use: No     Colonoscopy:  PAP:  Bone density:  Lipid panel:  Allergies  Allergen Reactions  . Pregabalin Diarrhea, Other (See Comments) and Swelling  . Seroquel [Quetiapine Fumarate] Other (See Comments)    hallunications    Current Outpatient Medications    Medication Sig Dispense Refill  . albuterol (VENTOLIN HFA) 108 (90 Base) MCG/ACT inhaler TAKE TWO PUFFS EVERY 4 TO 6 HOURS AS NEEDED FOR SHORTNESS OF BREATH. 18 g 2  . apixaban (ELIQUIS) 5 MG TABS tablet Take 1 tablet (5 mg total) by mouth 2 (two) times daily. 60 tablet 5  . buPROPion (WELLBUTRIN SR) 150 MG 12 hr tablet Take by mouth See admin instructions. Takes (300 mg) 2 tabs by mouth in morning and (150 mg) 1 tab by mouth in evening    . diclofenac (FLECTOR) 1.3 % PTCH Flector 1.3 % transdermal 12 hour patch  Apply 1 patch twice a day by transdermal route as needed for 30 days.    . furosemide (LASIX) 40 MG tablet Take 40 mg by mouth daily as needed (for fluid retention/swelling).    Marland Kitchen ibuprofen (ADVIL,MOTRIN) 600 MG tablet Take 1 tablet (600 mg total) by mouth every 6 (six) hours as needed (breakthrough pain). 30 tablet 0  . lisinopril (ZESTRIL) 10 MG tablet Take 10 mg by mouth daily.    . nortriptyline (PAMELOR) 10 MG capsule Take 10 mg by mouth at bedtime.    Marland Kitchen omeprazole (PRILOSEC) 40 MG capsule Take 1 capsule (40 mg total) by mouth daily. 30 capsule 12  . oxyCODONE (ROXICODONE) 15 MG immediate release tablet Take 15 mg by mouth 5 (five) times daily.    . potassium chloride (K-DUR) 10 MEQ tablet Take 10 mEq by mouth daily as needed (with lasix use for retention/swelling).    . pyridoxine (B-6) 100 MG tablet Vitamin B-6 100 mg tablet  Take 1 tablet twice a day by oral route for 42 days.    Marland Kitchen scopolamine (TRANSDERM-SCOP, 1.5 MG,) 1 MG/3DAYS Transderm-Scop 1.5 mg transdermal patch (1 mg over 3 days)  APPLY 1 PATCH TO SKIN AS NEEDED EVERY 3 DAYS.    Marland Kitchen tiZANidine (ZANAFLEX) 4 MG tablet Take 4 mg by mouth at bedtime.    . traMADol (ULTRAM-ER) 100 MG 24 hr tablet Take 1 tablet by mouth daily.    . valACYclovir (VALTREX) 1000 MG tablet Take 1 tablet (1,000 mg total) by mouth 3 (three) times daily. 7 tablet 0  . Vitamin D, Ergocalciferol, (DRISDOL) 50000 units CAPS capsule Take 50,000 Units by  mouth every Thursday.    Marland Kitchen apixaban (ELIQUIS) 5 MG TABS tablet Take 2 tablets (10 mg total) by mouth 2 (two) times daily for 7 days. 28 tablet 0   No current facility-administered medications for this visit.    OBJECTIVE: Vitals:   10/28/19 1102  BP: (!) 151/80  Pulse: 80  Resp: 20  Temp: (!) 97.5 F (36.4 C)  SpO2: 98%     Body mass index is 36.38 kg/m.    ECOG FS:0 - Asymptomatic  General: Well-developed, well-nourished, no acute distress. Eyes: Pink conjunctiva, anicteric sclera. HEENT: Normocephalic, moist mucous membranes. Lungs: No audible wheezing or coughing. Heart: Regular rate and rhythm. Abdomen: Soft, nontender, no obvious distention. Musculoskeletal: No edema, cyanosis, or clubbing. Neuro: Alert, answering all questions appropriately. Cranial nerves grossly intact. Skin: No rashes or petechiae noted. Psych: Normal  affect.   LAB RESULTS:  Lab Results  Component Value Date   NA 142 04/30/2019   K 3.2 (L) 04/30/2019   CL 103 04/30/2019   CO2 29 04/30/2019   GLUCOSE 111 (H) 04/30/2019   BUN 17 04/30/2019   CREATININE 1.00 10/25/2019   CALCIUM 8.4 (L) 04/30/2019   PROT 7.7 04/30/2019   ALBUMIN 4.2 04/30/2019   AST 18 04/30/2019   ALT 23 04/30/2019   ALKPHOS 91 04/30/2019   BILITOT 0.3 04/30/2019   GFRNONAA >60 04/30/2019   GFRAA >60 04/30/2019    Lab Results  Component Value Date   WBC 14.7 (H) 04/30/2019   NEUTROABS 6.8 07/06/2018   HGB 13.0 04/30/2019   HCT 40.4 04/30/2019   MCV 93.1 04/30/2019   PLT 306 04/30/2019     STUDIES: CT Chest W Contrast  Result Date: 10/26/2019 CLINICAL DATA:  Patient with history of left upper lobe lung cancer status post lobectomy in 2015. Follow-up exam. EXAM: CT CHEST WITH CONTRAST TECHNIQUE: Multidetector CT imaging of the chest was performed during intravenous contrast administration. CONTRAST:  1m OMNIPAQUE IOHEXOL 300 MG/ML  SOLN COMPARISON:  Chest CT 04/30/2019 FINDINGS: Cardiovascular: Normal heart  size. Trace fluid superior pericardial recess. Mediastinum/Nodes: Unchanged 1.2 cm lower paratracheal lymph node (image 46; series 2). Unchanged 1 cm right hilar node (image 54; series 2). Similar 1 cm prevascular node (image 35; series 2). Lungs/Pleura: Postsurgical changes compatible with left upper lobectomy. Stable appearance of the residual left lung. Unchanged 3 mm subpleural right upper lobe nodule (image 26; series 3). Stable 7 mm right lower lobe nodule (image 76; series 3). No large area pulmonary consolidation. No pleural effusion or pneumothorax. Upper Abdomen: No acute process. Stable probable small cyst superior pole left kidney. Musculoskeletal: No aggressive or acute appearing osseous lesions. IMPRESSION: Stable postsurgical changes compatible with left upper lobectomy. No evidence for recurrent or metastatic disease in the chest. Similar mildly enlarged mediastinal and right hilar lymph nodes likely reactive in etiology. Similar 7 mm sub solid right lower lobe nodule. Recommend continued attention on follow-up. Electronically Signed   By: DLovey NewcomerM.D.   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  PLAN:    1. 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 18 lymph nodes were negative. EGFR and ALK were negative as well. She subsequently received 4 cycles of adjuvant cisplatin and Alimta between May and August 2015. She did not receive maintenance Alimta.  Her most recent imaging on October 25, 2019 was reviewed independently and report as above with no obvious evidence of progressive or recurrent disease.  No further intervention is needed.  Patient is now nearly 6 years removed from completing her treatment and can discontinue routine imaging.  No further follow-up is necessary in the cancer center.  Please refer her back if there are any questions or concerns.     2.  Pulmonary embolism: Incidentally diagnosed on April 30, 2019.  She appeared to have no obvious transient risk factors.  Full hypercoagulable work-up was negative.  Patient has now completed 6 months of Eliquis and has been instructed to discontinue treatment.  No further follow-up as above.   Patient expressed understanding and was in agreement with this plan. She also understands that She can call clinic at any time with any questions, concerns, or complaints.   Cancer  Staging Lung cancer [facility]) Staging form: Lung, AJCC 7th Edition - Clinical: Stage IIB (yT3, N0, M0) - Unsigned   Lloyd Huger, MD   10/29/2019 7:13 AM

## 2019-10-28 ENCOUNTER — Other Ambulatory Visit: Payer: Self-pay

## 2019-10-28 ENCOUNTER — Encounter: Payer: Self-pay | Admitting: Oncology

## 2019-10-28 ENCOUNTER — Inpatient Hospital Stay: Payer: Medicaid Other | Attending: Oncology | Admitting: Oncology

## 2019-10-28 VITALS — BP 151/80 | HR 80 | Temp 97.5°F | Resp 20 | Wt 205.4 lb

## 2019-10-28 DIAGNOSIS — Z7901 Long term (current) use of anticoagulants: Secondary | ICD-10-CM | POA: Insufficient documentation

## 2019-10-28 DIAGNOSIS — Z9079 Acquired absence of other genital organ(s): Secondary | ICD-10-CM | POA: Insufficient documentation

## 2019-10-28 DIAGNOSIS — F17210 Nicotine dependence, cigarettes, uncomplicated: Secondary | ICD-10-CM | POA: Diagnosis not present

## 2019-10-28 DIAGNOSIS — F419 Anxiety disorder, unspecified: Secondary | ICD-10-CM | POA: Insufficient documentation

## 2019-10-28 DIAGNOSIS — Z808 Family history of malignant neoplasm of other organs or systems: Secondary | ICD-10-CM | POA: Insufficient documentation

## 2019-10-28 DIAGNOSIS — Z90710 Acquired absence of both cervix and uterus: Secondary | ICD-10-CM | POA: Diagnosis not present

## 2019-10-28 DIAGNOSIS — Z79899 Other long term (current) drug therapy: Secondary | ICD-10-CM | POA: Diagnosis not present

## 2019-10-28 DIAGNOSIS — Z791 Long term (current) use of non-steroidal anti-inflammatories (NSAID): Secondary | ICD-10-CM | POA: Insufficient documentation

## 2019-10-28 DIAGNOSIS — Z90721 Acquired absence of ovaries, unilateral: Secondary | ICD-10-CM | POA: Diagnosis not present

## 2019-10-28 DIAGNOSIS — Z8249 Family history of ischemic heart disease and other diseases of the circulatory system: Secondary | ICD-10-CM | POA: Diagnosis not present

## 2019-10-28 DIAGNOSIS — Z85118 Personal history of other malignant neoplasm of bronchus and lung: Secondary | ICD-10-CM | POA: Insufficient documentation

## 2019-10-28 DIAGNOSIS — I2699 Other pulmonary embolism without acute cor pulmonale: Secondary | ICD-10-CM

## 2019-10-28 DIAGNOSIS — I10 Essential (primary) hypertension: Secondary | ICD-10-CM | POA: Diagnosis not present

## 2019-10-28 DIAGNOSIS — Z85828 Personal history of other malignant neoplasm of skin: Secondary | ICD-10-CM | POA: Diagnosis not present

## 2019-10-28 DIAGNOSIS — C3402 Malignant neoplasm of left main bronchus: Secondary | ICD-10-CM

## 2019-10-28 DIAGNOSIS — Z86711 Personal history of pulmonary embolism: Secondary | ICD-10-CM | POA: Diagnosis present

## 2019-10-28 DIAGNOSIS — K219 Gastro-esophageal reflux disease without esophagitis: Secondary | ICD-10-CM | POA: Diagnosis not present

## 2019-10-28 NOTE — Progress Notes (Signed)
Patient here today for 1 year follow up. Reports some pain in back, knees, and arm. Denies other concerns at this time.

## 2020-11-05 IMAGING — CT CT RENAL STONE PROTOCOL
2 of 4 series · 16 of 46 positions shown, 18 images · non-contrast
Comparison: CT chest October 18, 2018, CT abdomen pelvis April 07, 2016

CLINICAL DATA: Left flank pain since [REDACTED] morning no history of
urolithiasis

EXAM:
CT ABDOMEN AND PELVIS WITHOUT CONTRAST
TECHNIQUE: Multidetector CT imaging of the abdomen and pelvis was performed
following the standard protocol without IV contrast.

[Series 2: stone full standard · axial · 0.87mm/px · z∈[-476,-61]mm · 13 of 91 slices shown, 15 images]
[im 4/91  soft-tissue]
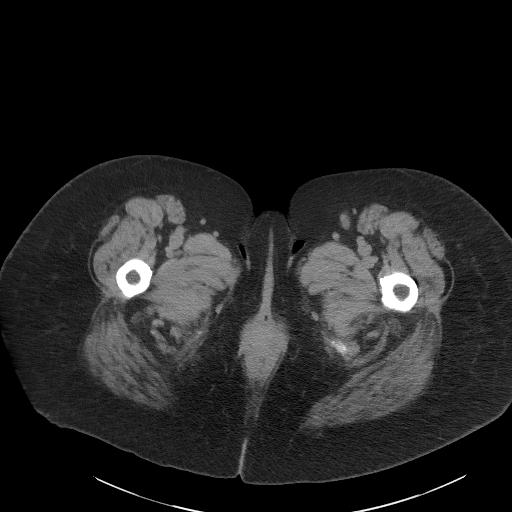
[im 4/91  bone]
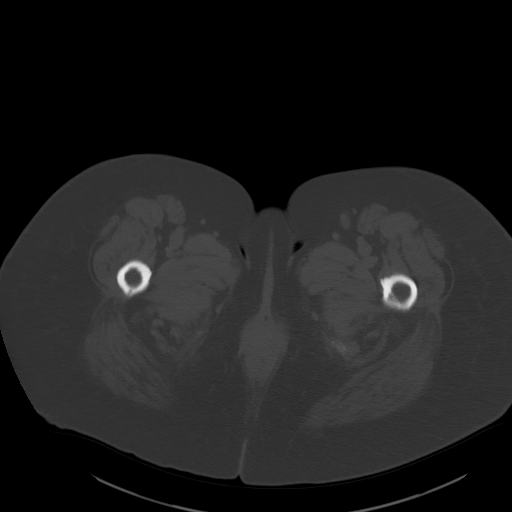
[im 12/91  soft-tissue]
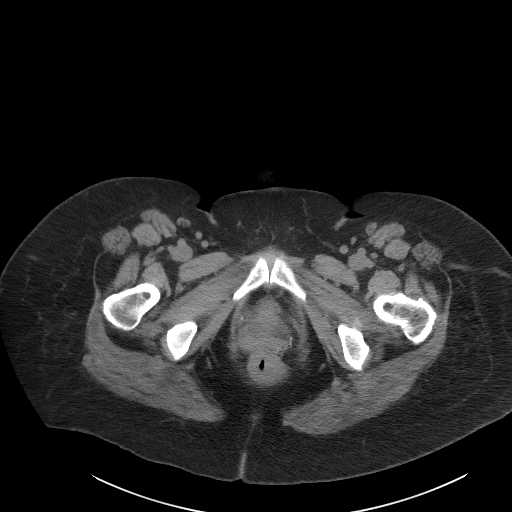
[im 19/91  soft-tissue]
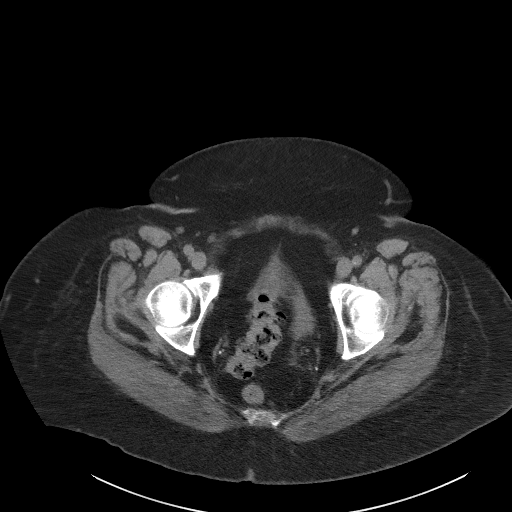
[im 27/91  soft-tissue]
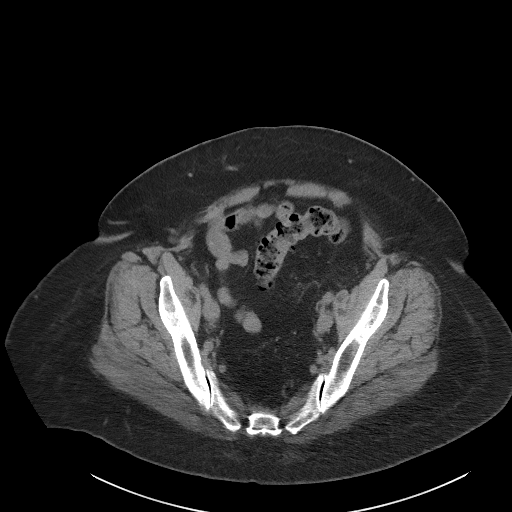
[im 31/91  soft-tissue]
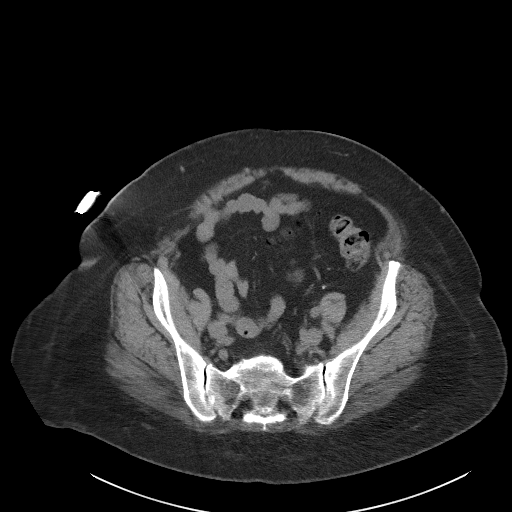
[im 38/91  soft-tissue]
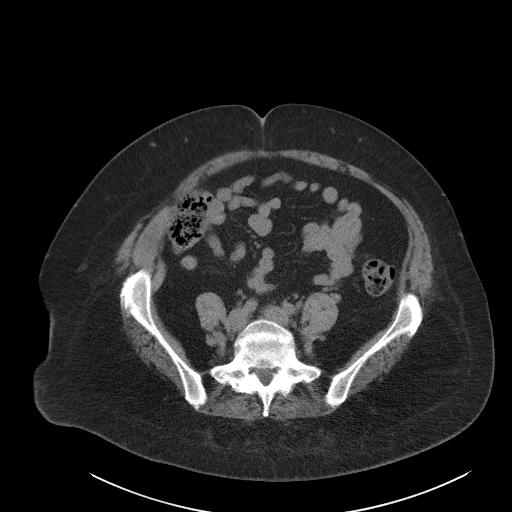
[im 46/91  soft-tissue]
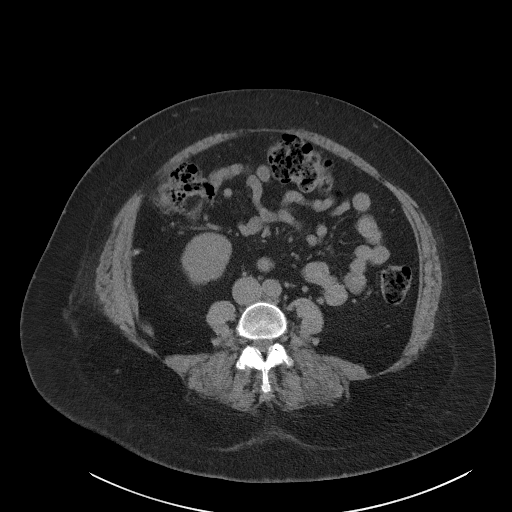
[im 53/91  soft-tissue]
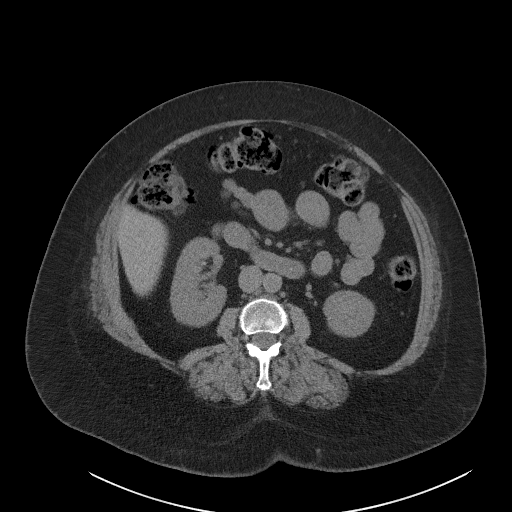
[im 61/91  soft-tissue]
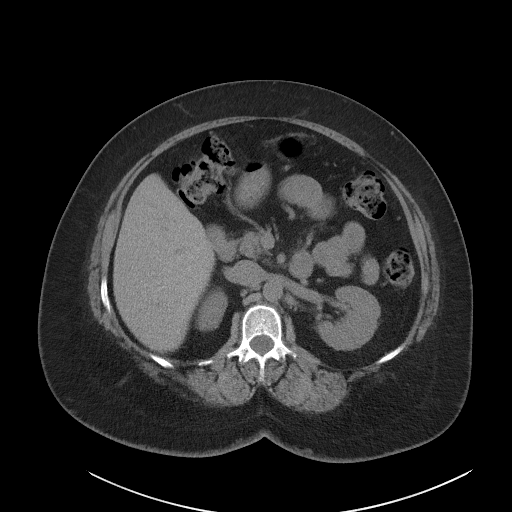
[im 61/91  bone]
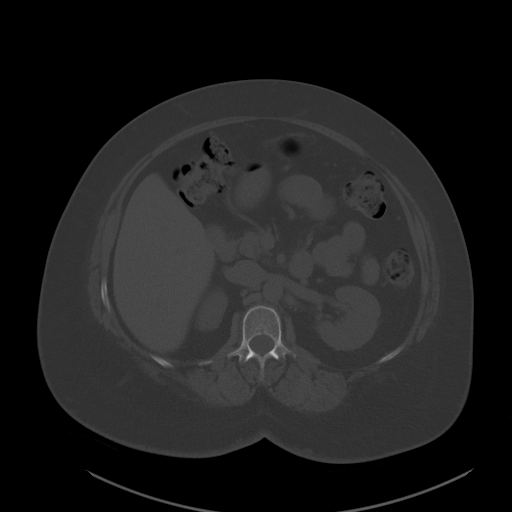
[im 64/91  soft-tissue]
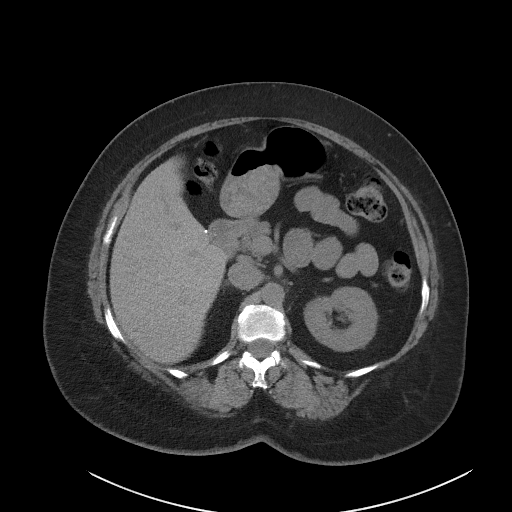
[im 72/91  soft-tissue]
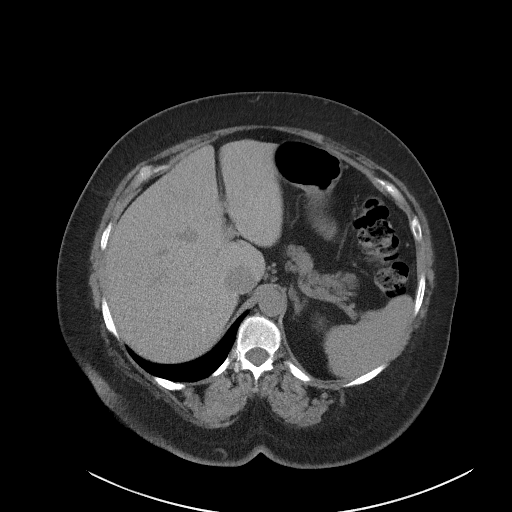
[im 79/91  soft-tissue]
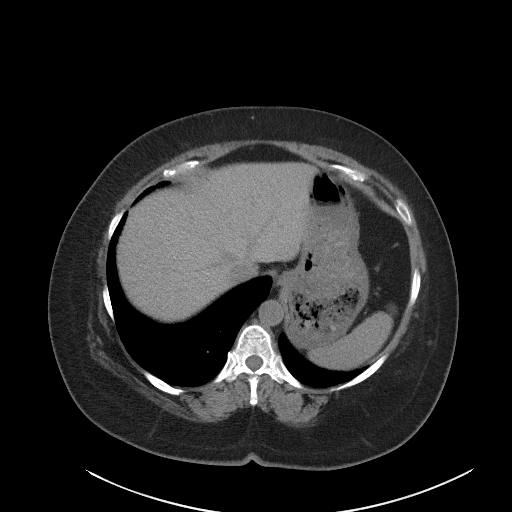
[im 87/91  soft-tissue]
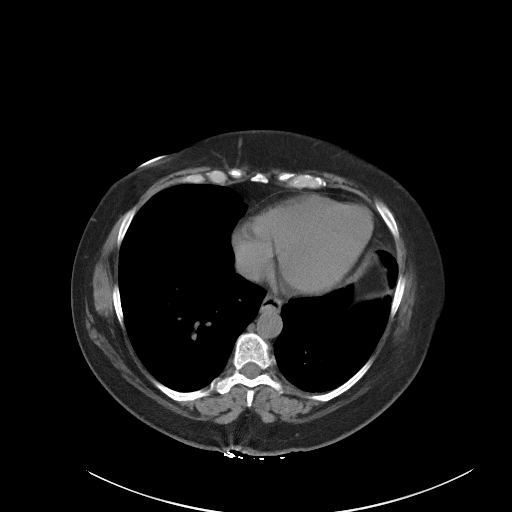

[Series 5: coronal · coronal · 0.88mm/px · 3 of 170 slices shown]
[im 57/170  soft-tissue]
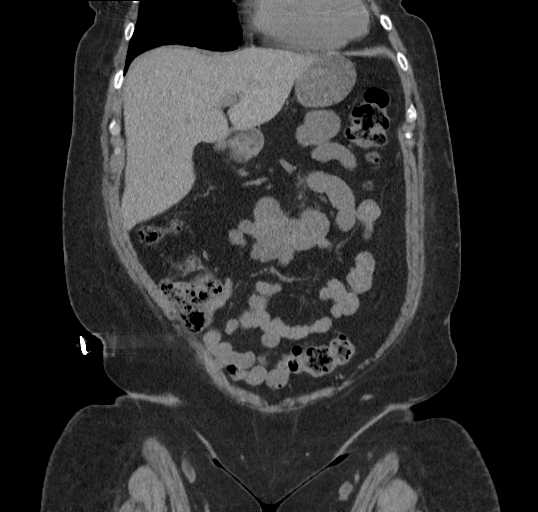
[im 76/170  soft-tissue]
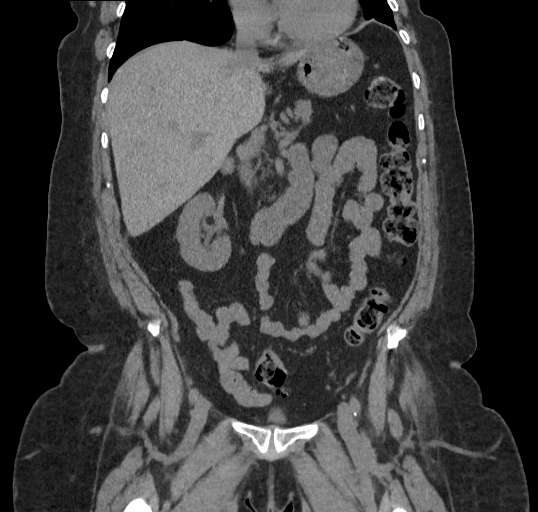
[im 94/170  soft-tissue]
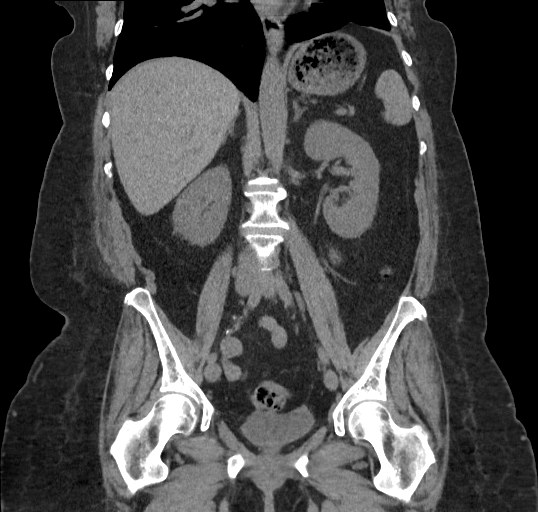

[16 of 46 positions shown; findings below may reference images not displayed]

FINDINGS: Lower chest: Bandlike areas of scarring and/or atelectasis in the
left lung base. Lung bases otherwise clear. Normal heart size. No
pericardial effusion.

Hepatobiliary: No focal liver abnormality is seen. Patient is post
cholecystectomy. Slight prominence of the biliary tree likely
related to reservoir effect. No calcified intraductal gallstones.

Pancreas: Unremarkable. No pancreatic ductal dilatation or
surrounding inflammatory changes.

Spleen: Normal in size without focal abnormality.

Adrenals/Urinary Tract: Normal adrenal glands. Stable left upper
pole renal cyst. Stable fat attenuation lesion in the right lower
pole likely angiomyolipoma. No worrisome renal lesions. No visible
urolithiasis or hydronephrosis. Urinary bladder is largely
decompressed at the time of exam and therefore poorly evaluated by
CT imaging. No bladder calculi or calcifications along the expected
course of the ureter.

Stomach/Bowel: Distal esophagus, stomach and duodenal sweep are
unremarkable. No small bowel wall thickening or dilatation. No
evidence of obstruction. A normal appendix is visualized. No colonic
dilatation or wall thickening. Scattered colonic diverticula without
focal pericolonic inflammation to suggest diverticulitis.

Vascular/Lymphatic: Atherosclerotic plaque within the normal caliber
aorta. No suspicious or enlarged lymph nodes in the included
lymphatic chains.

Reproductive: Uterus is surgically absent. No concerning adnexal
lesions.

Other: Postsurgical changes from prior low transverse abdominal
incision. No bowel containing hernias. No free air or free fluid in
the abdomen or pelvis. No organized collection or abscess.

Musculoskeletal: Partial lumbarization of the L5 vertebra. Mild
grade 1 anterolisthesis L4 on L5. Multilevel degenerative changes
are present in the imaged portions of the spine. No acute osseous
abnormality or suspicious osseous lesion.
IMPRESSION: 1. No acute CT findings to account for left flank pain.
Specifically, no visible urolithiasis or hydronephrosis.
2. Stable left upper pole renal cyst. Stable right lower pole
angiomyolipoma.
3. Colonic diverticulosis without evidence of diverticulitis.
4. Aortic Atherosclerosis ([id number]).

## 2023-02-24 ENCOUNTER — Other Ambulatory Visit: Payer: Self-pay

## 2023-02-24 ENCOUNTER — Emergency Department
Admission: EM | Admit: 2023-02-24 | Discharge: 2023-02-24 | Disposition: A | Discharge status: Home / Self Care | Payer: Medicaid Other | Attending: Emergency Medicine | Admitting: Emergency Medicine

## 2023-02-24 DIAGNOSIS — L02211 Cutaneous abscess of abdominal wall: Secondary | ICD-10-CM | POA: Diagnosis present

## 2023-02-24 DIAGNOSIS — I10 Essential (primary) hypertension: Secondary | ICD-10-CM | POA: Diagnosis not present

## 2023-02-24 DIAGNOSIS — Z85828 Personal history of other malignant neoplasm of skin: Secondary | ICD-10-CM | POA: Insufficient documentation

## 2023-02-24 DIAGNOSIS — L0291 Cutaneous abscess, unspecified: Secondary | ICD-10-CM

## 2023-02-24 MED ORDER — SULFAMETHOXAZOLE-TRIMETHOPRIM 800-160 MG PO TABS
1.0000 | ORAL_TABLET | Freq: Two times a day (BID) | ORAL | 0 refills | Status: AC
Start: 2023-02-24 — End: 2023-03-01

## 2023-02-24 NOTE — Discharge Instructions (Addendum)
Please take the antibiotic as prescribed. Try using a benzyl peroxide body wash to reduce reoccurence. Wait until the boil has healed before using this body wash. Please wash the boil with soap and water daily. You can cover it with gauze to prevent it from draining on your clothes.   Watch for signs of infection including redness, warmth, swelling, pain and pus drainage.  If you develop any of these please return to the ED, urgent care or your primary care provider. You should start to see improvement in a couple of days.   Apply warm compresses to it to help it drain.

## 2023-02-24 NOTE — ED Triage Notes (Signed)
Pt comes with c/o abscess to right sided area. Pt states this started week ago. Pt states drainage noted.

## 2023-02-24 NOTE — ED Provider Notes (Signed)
[facility] Provider Note    None    (approximate)   History   Abscess   HPI  Natalie Stark is a 62 y.o. female with PMH of arthritis, anxiety, HTN and skin cancer presents for evaluation of an abscess. Patient states she has had multple boils before and that it runs in her family. It has begun to drain on it's own but it is very painful. No fevers at home.      Physical Exam   Triage Vital Signs: ED Triage Vitals  Encounter Vitals Group     BP 02/24/23 1826 (!) 149/103     Systolic BP Percentile --      Diastolic BP Percentile --      Pulse Rate 02/24/23 1826 (!) 115     Resp 02/24/23 1826 19     Temp 02/24/23 1826 98.9 F (37.2 C)     Temp src --      SpO2 02/24/23 1826 100 %     Weight --      Height --      Head Circumference --      Peak Flow --      Pain Score 02/24/23 1825 10     Pain Loc --      Pain Education --      Exclude from Growth Chart --     Most recent vital signs: Vitals:   02/24/23 1826  BP: (!) 149/103  Pulse: (!) 115  Resp: 19  Temp: 98.9 F (37.2 C)  SpO2: 100%    General: Awake, no distress.  CV:  Good peripheral perfusion.  Resp:  Normal effort.  Abd:  No distention.  Other:  Approximately 1 cm in diameter raised nodule on patient's right lower abdomen with surrounding induration and open wound, small amount of fluctuance, wound is scabbed over   ED Results / Procedures / Treatments   Labs (all labs ordered are listed, but only abnormal results are displayed) Labs Reviewed - No data to display   PROCEDURES:  Critical Care performed: No  Procedures   MEDICATIONS ORDERED IN ED: Medications - No data to display   IMPRESSION / MDM / ASSESSMENT AND PLAN / ED COURSE  I reviewed the triage vital signs and the nursing notes.                             62 year old female presents for evaluation of an abscess. Patient was hypertensive and tachycardic in triage, otherwise VSS. Patient in  NAD but does report significant pain.  Differential diagnosis includes, but is not limited to, abscess, folliculitis, cellulitis.  Patient's presentation is most consistent with acute, uncomplicated illness.  Patient does have a small abscess on the right lower abdomen, there is a small amount of fluctuance but it has been draining on it's own so I do not feel that an I&D is warranted at this point. Patient will be started on oral antibiotics. I encouraged her to apply warm compresses. She was instructed on wound care. She voiced understanding, all questions were answered and she was stable at discharge.      FINAL CLINICAL IMPRESSION(S) / ED DIAGNOSES   Final diagnoses:  Abscess     Rx / DC Orders   ED Discharge Orders          Ordered    sulfamethoxazole-trimethoprim (BACTRIM DS) 800-160 MG tablet  2 times daily  02/24/23 1833             Note:  This document was prepared using Dragon voice recognition software and may include unintentional dictation errors.   Cameron Ali, PA-C 02/24/23 Luiz Iron    Jene Every, MD 02/24/23 1904

## 2023-03-02 ENCOUNTER — Other Ambulatory Visit: Payer: Self-pay

## 2023-03-02 ENCOUNTER — Emergency Department
Admission: EM | Admit: 2023-03-02 | Discharge: 2023-03-02 | Disposition: A | Discharge status: Home / Self Care | Payer: Medicaid Other | Attending: Emergency Medicine | Admitting: Emergency Medicine

## 2023-03-02 DIAGNOSIS — Y92410 Unspecified street and highway as the place of occurrence of the external cause: Secondary | ICD-10-CM | POA: Diagnosis not present

## 2023-03-02 DIAGNOSIS — I10 Essential (primary) hypertension: Secondary | ICD-10-CM | POA: Diagnosis not present

## 2023-03-02 DIAGNOSIS — M25532 Pain in left wrist: Secondary | ICD-10-CM | POA: Insufficient documentation

## 2023-03-02 DIAGNOSIS — M25522 Pain in left elbow: Secondary | ICD-10-CM | POA: Diagnosis not present

## 2023-03-02 DIAGNOSIS — M25512 Pain in left shoulder: Secondary | ICD-10-CM | POA: Insufficient documentation

## 2023-03-02 NOTE — Discharge Instructions (Signed)
You can take 650 mg of Tylenol and 600 mg of ibuprofen every 6 hours as needed for pain.  You can use topical pain relievers, ice and heat as needed.
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
## 2024-06-06 ENCOUNTER — Encounter: Payer: Self-pay | Admitting: Gastroenterology

## 2024-06-13 ENCOUNTER — Other Ambulatory Visit: Payer: Self-pay | Admitting: Psychiatry

## 2024-06-13 DIAGNOSIS — M546 Pain in thoracic spine: Secondary | ICD-10-CM
